# Patient Record
Sex: Female | Born: 1975 | Race: White | Hispanic: Yes | Marital: Single | Smoking: Never smoker
Health system: Southern US, Community
[De-identification: ages and names within clinical notes are randomized; demographics above are authoritative.]

## PROBLEM LIST (undated history)

## (undated) DIAGNOSIS — K219 Gastro-esophageal reflux disease without esophagitis: Secondary | ICD-10-CM

## (undated) DIAGNOSIS — G4700 Insomnia, unspecified: Secondary | ICD-10-CM

## (undated) HISTORY — DX: Insomnia, unspecified: G47.00

## (undated) HISTORY — DX: Gastro-esophageal reflux disease without esophagitis: K21.9

---

## 2006-05-31 ENCOUNTER — Inpatient Hospital Stay (HOSPITAL_COMMUNITY): Admission: AD | Admit: 2006-05-31 | Discharge: 2006-05-31 | Payer: Self-pay | Admitting: Obstetrics and Gynecology

## 2006-09-04 ENCOUNTER — Ambulatory Visit: Payer: Self-pay | Admitting: Obstetrics and Gynecology

## 2006-09-04 ENCOUNTER — Inpatient Hospital Stay (HOSPITAL_COMMUNITY): Admission: AD | Admit: 2006-09-04 | Discharge: 2006-09-04 | Payer: Self-pay | Admitting: Obstetrics & Gynecology

## 2006-09-08 ENCOUNTER — Ambulatory Visit (HOSPITAL_COMMUNITY): Admission: RE | Admit: 2006-09-08 | Discharge: 2006-09-08 | Payer: Self-pay | Admitting: Obstetrics & Gynecology

## 2006-09-20 ENCOUNTER — Ambulatory Visit: Payer: Self-pay | Admitting: Obstetrics and Gynecology

## 2006-09-20 ENCOUNTER — Inpatient Hospital Stay (HOSPITAL_COMMUNITY): Admission: AD | Admit: 2006-09-20 | Discharge: 2006-09-23 | Payer: Self-pay | Admitting: Obstetrics and Gynecology

## 2006-09-30 ENCOUNTER — Inpatient Hospital Stay (HOSPITAL_COMMUNITY): Admission: AD | Admit: 2006-09-30 | Discharge: 2006-09-30 | Payer: Self-pay | Admitting: Family Medicine

## 2006-09-30 ENCOUNTER — Ambulatory Visit: Payer: Self-pay | Admitting: Family Medicine

## 2006-10-01 ENCOUNTER — Ambulatory Visit: Payer: Self-pay | Admitting: *Deleted

## 2006-10-08 ENCOUNTER — Ambulatory Visit: Payer: Self-pay | Admitting: Obstetrics & Gynecology

## 2006-10-13 ENCOUNTER — Inpatient Hospital Stay (HOSPITAL_COMMUNITY): Admission: RE | Admit: 2006-10-13 | Discharge: 2006-10-15 | Payer: Self-pay | Admitting: Obstetrics and Gynecology

## 2006-10-13 ENCOUNTER — Ambulatory Visit: Payer: Self-pay | Admitting: Obstetrics and Gynecology

## 2006-10-13 ENCOUNTER — Encounter: Payer: Self-pay | Admitting: Obstetrics and Gynecology

## 2007-04-30 ENCOUNTER — Ambulatory Visit: Payer: Self-pay | Admitting: Internal Medicine

## 2007-04-30 DIAGNOSIS — R519 Headache, unspecified: Secondary | ICD-10-CM | POA: Insufficient documentation

## 2007-04-30 DIAGNOSIS — L988 Other specified disorders of the skin and subcutaneous tissue: Secondary | ICD-10-CM | POA: Insufficient documentation

## 2007-04-30 DIAGNOSIS — R51 Headache: Secondary | ICD-10-CM

## 2007-04-30 DIAGNOSIS — Z87448 Personal history of other diseases of urinary system: Secondary | ICD-10-CM

## 2007-04-30 LAB — CONVERTED CEMR LAB
Bilirubin Urine: NEGATIVE
Glucose, Urine, Semiquant: NEGATIVE
Ketones, urine, test strip: NEGATIVE
Urobilinogen, UA: NEGATIVE

## 2007-05-07 ENCOUNTER — Ambulatory Visit (HOSPITAL_COMMUNITY): Admission: RE | Admit: 2007-05-07 | Discharge: 2007-05-07 | Payer: Self-pay | Admitting: Internal Medicine

## 2007-05-11 ENCOUNTER — Encounter (INDEPENDENT_AMBULATORY_CARE_PROVIDER_SITE_OTHER): Payer: Self-pay | Admitting: *Deleted

## 2007-05-11 LAB — CONVERTED CEMR LAB
BUN: 8 mg/dL (ref 6–23)
CO2: 21 meq/L (ref 19–32)
Chloride: 109 meq/L (ref 96–112)
Creatinine, Ser: 0.62 mg/dL (ref 0.40–1.20)
Potassium: 4.4 meq/L (ref 3.5–5.3)

## 2007-05-14 ENCOUNTER — Encounter (INDEPENDENT_AMBULATORY_CARE_PROVIDER_SITE_OTHER): Payer: Self-pay | Admitting: Internal Medicine

## 2007-06-05 ENCOUNTER — Encounter (INDEPENDENT_AMBULATORY_CARE_PROVIDER_SITE_OTHER): Payer: Self-pay | Admitting: Internal Medicine

## 2007-09-10 ENCOUNTER — Telehealth (INDEPENDENT_AMBULATORY_CARE_PROVIDER_SITE_OTHER): Payer: Self-pay | Admitting: Internal Medicine

## 2007-10-29 ENCOUNTER — Encounter (INDEPENDENT_AMBULATORY_CARE_PROVIDER_SITE_OTHER): Payer: Self-pay | Admitting: Internal Medicine

## 2008-06-30 ENCOUNTER — Ambulatory Visit: Payer: Self-pay | Admitting: Internal Medicine

## 2008-06-30 DIAGNOSIS — M542 Cervicalgia: Secondary | ICD-10-CM

## 2008-06-30 DIAGNOSIS — M549 Dorsalgia, unspecified: Secondary | ICD-10-CM | POA: Insufficient documentation

## 2008-06-30 LAB — CONVERTED CEMR LAB
Ketones, urine, test strip: NEGATIVE
Nitrite: NEGATIVE
Protein, U semiquant: 30
Specific Gravity, Urine: 1.015
Urobilinogen, UA: 1
pH: 7

## 2008-07-17 ENCOUNTER — Ambulatory Visit (HOSPITAL_COMMUNITY): Admission: RE | Admit: 2008-07-17 | Discharge: 2008-07-17 | Payer: Self-pay | Admitting: Internal Medicine

## 2008-07-22 ENCOUNTER — Encounter (INDEPENDENT_AMBULATORY_CARE_PROVIDER_SITE_OTHER): Payer: Self-pay | Admitting: Internal Medicine

## 2008-07-26 ENCOUNTER — Telehealth (INDEPENDENT_AMBULATORY_CARE_PROVIDER_SITE_OTHER): Payer: Self-pay | Admitting: Internal Medicine

## 2008-07-28 ENCOUNTER — Telehealth (INDEPENDENT_AMBULATORY_CARE_PROVIDER_SITE_OTHER): Payer: Self-pay | Admitting: Internal Medicine

## 2008-08-04 ENCOUNTER — Telehealth (INDEPENDENT_AMBULATORY_CARE_PROVIDER_SITE_OTHER): Payer: Self-pay | Admitting: *Deleted

## 2008-08-30 ENCOUNTER — Ambulatory Visit: Payer: Self-pay | Admitting: Internal Medicine

## 2008-09-12 ENCOUNTER — Encounter (INDEPENDENT_AMBULATORY_CARE_PROVIDER_SITE_OTHER): Payer: Self-pay | Admitting: Internal Medicine

## 2008-09-15 ENCOUNTER — Encounter (INDEPENDENT_AMBULATORY_CARE_PROVIDER_SITE_OTHER): Payer: Self-pay | Admitting: Internal Medicine

## 2008-09-21 ENCOUNTER — Ambulatory Visit (HOSPITAL_COMMUNITY): Admission: RE | Admit: 2008-09-21 | Discharge: 2008-09-21 | Payer: Self-pay | Admitting: Internal Medicine

## 2008-09-22 ENCOUNTER — Encounter (INDEPENDENT_AMBULATORY_CARE_PROVIDER_SITE_OTHER): Payer: Self-pay | Admitting: Internal Medicine

## 2008-10-09 ENCOUNTER — Encounter (INDEPENDENT_AMBULATORY_CARE_PROVIDER_SITE_OTHER): Payer: Self-pay | Admitting: Internal Medicine

## 2009-08-26 ENCOUNTER — Encounter (INDEPENDENT_AMBULATORY_CARE_PROVIDER_SITE_OTHER): Payer: Self-pay | Admitting: Internal Medicine

## 2010-03-11 ENCOUNTER — Encounter: Payer: Self-pay | Admitting: *Deleted

## 2010-03-12 ENCOUNTER — Encounter: Payer: Self-pay | Admitting: Internal Medicine

## 2010-03-20 NOTE — Miscellaneous (Signed)
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 Jearld Fenton 09/12/08--wanted to see CT to be certain not an encephalocele--then to discuss possible surgery

## 2010-03-20 NOTE — Letter (Signed)
Summary: [facility]  [facility]   Imported By: Arta Bruce 10/09/2009 11:26:54  _____________________________________________________________________  External Attachment:    Type:   Image     Comment:   External Document

## 2010-07-03 NOTE — Discharge Summary (Signed)
Carmen, Pierce              ACCOUNT NO.:  [id number]   MEDICAL RECORD NO.:  [id number]          PATIENT TYPE:  INP   LOCATION:  9158                          FACILITY:  [facility]   PHYSICIAN:  Ginger Carne, MD  DATE OF BIRTH:  [birth year]   DATE OF ADMISSION:  09/20/2006  DATE OF DISCHARGE:  09/23/2006                               DISCHARGE SUMMARY   REASON FOR ADMISSION:  Right jaw abscess with pregnancy at [redacted] weeks  gestation, not in active labor.   DISCHARGE DIAGNOSIS:  Right jaw abscess with pregnancy at [redacted] weeks  gestation, not in active labor.   SIGNIFICANT FINDINGS:  On admission, a urine culture was drawn that  showed insignificant growth.  On August 2nd, a swab of the mouth showed  no white blood cells, a few gram-negative rods, a few gram-positive  cocci in pairs and in clusters ruled as normal oropharyngeal flora.  On  August 3rd, a maxillofacial CT scan with contrast was performed and was  significant for a 1 x 3 cm abscess adjacent to the outer cortex of the  right mandible that was associated with apparent large dental cavity in  tooth #30.  There was trace periapical lucency around tooth #30 and also  associated with tooth #31 according to report read by the radiologist on  the CT scan.  Initially on admission on August 3rd, a CBC was drawn that  was significant for a white blood cell count of 13.8 with 82%  neutrophils.  Also on admission, the patient had a temperature of 99.8  on August 3rd at approximately 9 a.m. in the morning.  The rest of her  course in the hospital she did not have any fevers.   HOSPITAL COURSE:  The patient was initially found not to be in active  labor.  She was placed, initially, on ampicillin, gentamicin, and  clindamycin.  After results of the CT scan were found, Maxillofacial  Surgery was consulted and agreed that the patient could be changed to  clindamycin.  On the date of discharge, the patient continued to be  stable without  any adverse events and was discharged home with a 14-day  course of clindamycin 3 times daily.  The date of discharge is September 23, 2006.  The patient was scheduled for close followup with Dr. Warren Danes,  DDS, at 1:15 the same day of discharge for followup on her facial  abscess.   DISCHARGE INSTRUCTIONS:  1. The patient is to follow up with Dr. Warren Danes at [facility] on the date      of discharge, September 23, 2006.  2. The patient is also to take clindamycin orally 3 times daily, 900      mg for 14 days.  3. The patient is to swish and spit with salt water rinses and keep      her head elevated until treatment is completed or she receives      further direction from Dr. Warren Danes.   DISCHARGE MEDICATIONS:  Clindamycin 900 mg orally 3 times a day for 14  days.   DISCHARGE CONDITION:  Stable.  Myrtie Soman, MD      Ginger Carne, MD  Electronically Signed    TE/MEDQ  D:  09/23/2006  T:  09/23/2006  Job:  [id number]

## 2010-07-03 NOTE — Discharge Summary (Signed)
Carmen Pierce, Carmen Pierce              ACCOUNT NO.:  [id number]   MEDICAL RECORD NO.:  [id number]          PATIENT TYPE:  INP   LOCATION:  9147                          FACILITY:  [facility]   PHYSICIAN:  Ginger Carne, MD  DATE OF BIRTH:  [birth year]   DATE OF ADMISSION:  10/13/2006  DATE OF DISCHARGE:  10/15/2006                               DISCHARGE SUMMARY   FINAL DIAGNOSES:  1. Intrauterine pregnancy at 19 and 6 weeks.  2. Dental abscess.   REASON FOR ADMISSION:  This is a 35 year old G6, P6-0-0-6 that was  admitted for a repeat cesarean section for a normal intrauterine  pregnancy.   HOSPITAL COURSE:  This 35 year old G6, P6-0-0-6 at 66 and 6 weeks with a  history of dental abscess and previous C-sections was admitted and taken  to C-section on October 13, 2006.  The patient had a vertical incision  performed with no complications during the procedure.  Surgery was  performed by Dr. Okey Dupre assisted by Dr. Silas Flood.  The patient lost 500 mL of  blood during the procedure.  The patient and infant were stable post  procedure.  The patient was transferred to the floor.  Pain was  controlled, ambulating tolerating p.o., and recovering well with no  complications.  The patient will be discharged in good condition and  have followup in 6 weeks in [facility], and P[facility] Leisure  Love will come out on August 30 to remove her staples.   DISCHARGE MEDICATIONS:  1. Ibuprofen 600 mg p.o. q.6h. p.r.n. for pain.  2. Percocet 5/325 one to two tablets p.o. q.4-6h. p.r.n. for pain.  3. Colace 100 mg p.o. b.i.d. p.r.n. for constipation.  4. Ferrous sulfate 325 mg p.o. b.i.d. for anemia.  5. Prenatal vitamins one tablet p.o. once daily.   PERTINENT LABORATORY DATA:  The patient was O positive, antibody  negative.  Rubella immune.  HBS antigen negative.  Syphilis nonreactive.  HIV nonreactive.  GC negative.  Chlamydia negative.  GBS negative.  Discharge hemoglobin was 9.1, white blood cell  count was 8.5, and  platelets were 125.   DISCHARGE INSTRUCTIONS:  The patient is to have follow up at [facility] in 6 weeks and [facility] to remove staples on  October 18, 2006.     Marisue Ivan, MD      Ginger Carne, MD  Electronically Signed   KL/MEDQ  D:  10/15/2006  T:  10/15/2006  Job:  [id number]

## 2010-07-03 NOTE — Op Note (Signed)
Carmen Pierce, Carmen Pierce              ACCOUNT NO.:  [id number]   MEDICAL RECORD NO.:  [id number]          PATIENT TYPE:  INP   LOCATION:  9147                          FACILITY:  [facility]   PHYSICIAN:  Phil D. Okey Dupre, M.D.     DATE OF BIRTH:  1975-09-28   DATE OF PROCEDURE:  10/13/2006  DATE OF DISCHARGE:                               OPERATIVE REPORT   PROCEDURE:  Low transverse cesarean section and bilateral tubal  occlusion.   PREOPERATIVE DIAGNOSIS:  Repeat cesarean section and voluntary  sterilization.   POSTOPERATIVE DIAGNOSIS:  Repeat cesarean section and voluntary  sterilization.   SURGEON:  Dr. Okey Dupre   FIRST ASSISTANT:  Johnella Moloney, M.D.   ANESTHESIA:  Spinal.   SPECIMENS:  Placenta to pathology.   ESTIMATED BLOOD LOSS:  500 mL.   POSTOPERATIVE CONDITION:  Satisfactory.   DESCRIPTION OF PROCEDURE:  Under satisfactory spinal anesthesia with the  patient dorsal supine position.  A Foley catheter in urinary bladder,  abdomen was prepped and draped in usual sterile manner entered through a  vertical subumbilical incision extending from just below the umbilicus  to just above the symphysis pubis.  The abdomen was entered by layers.  On entering the peritoneal cavity visceral peritoneum the anterior  surface of the uterus was opened transversely by sharp dissection. The  bladder pushed away from the lower uterine segment and was entered by  sharp and blunt dissection and from a LOT presentation the baby was  quite easily delivered.  Cord doubly clamped, divided, baby handed  pediatrician.  Samples of blood taken from the cord for analysis,  placenta spontaneously removed.  The uterus explored and closed with  continuous running locked 0 Vicryl on an atraumatic needle.  Several  figure-of-eight sutures were placed into the suture line where there was  still some oozing to control that. The areas observed for bleeding and  none was noted.  Each fallopian tube was grasped from the  midportion and  a Filshie clip was placed across the tube at that point to occlude the  tube.  Tape, instrument, sponge and needle count reported correct.  This  time and the fascia was closed with continuous running PDS loop suture  and subcutaneous  closure with 2-0 plain catgut sutures.  Skin staples for skin edge  approximation.  Subcutaneous bleeders were controlled with hot cautery.  Dry sterile pressure dressing was applied.  The patient transferred to  recovery in satisfactory condition with Foley catheter draining clear  amber urine at the end of the procedure.      Phil D. Okey Dupre, M.D.  Electronically Signed     PDR/MEDQ  D:  10/13/2006  T:  10/13/2006  Job:  [id number]

## 2010-11-30 LAB — CBC
HCT: 31 — ABNORMAL LOW
Hemoglobin: 10.9 — ABNORMAL LOW
MCHC: 34.8
MCHC: 35.2
MCV: 94.3
Platelets: 125 — ABNORMAL LOW
Platelets: 130 — ABNORMAL LOW
Platelets: 149 — ABNORMAL LOW
RBC: 2.72 — ABNORMAL LOW
RBC: 2.84 — ABNORMAL LOW
RDW: 14.4 — ABNORMAL HIGH
RDW: 14.4 — ABNORMAL HIGH
WBC: 8.5

## 2010-11-30 LAB — DIFFERENTIAL
Eosinophils Absolute: 0.1
Lymphocytes Relative: 25
Lymphs Abs: 2.2
Monocytes Relative: 7
Neutro Abs: 5.6
Neutrophils Relative %: 66

## 2010-11-30 LAB — POCT URINALYSIS DIP (DEVICE)
Bilirubin Urine: NEGATIVE
Ketones, ur: NEGATIVE
Protein, ur: NEGATIVE
pH: 6

## 2010-12-03 LAB — URINE MICROSCOPIC-ADD ON

## 2010-12-03 LAB — URINALYSIS, ROUTINE W REFLEX MICROSCOPIC
Bilirubin Urine: NEGATIVE
Ketones, ur: NEGATIVE
Nitrite: NEGATIVE
Urobilinogen, UA: 1

## 2010-12-03 LAB — DIFFERENTIAL
Eosinophils Absolute: 0
Lymphs Abs: 1.5
Monocytes Absolute: 1 — ABNORMAL HIGH
Monocytes Relative: 7
Neutrophils Relative %: 82 — ABNORMAL HIGH

## 2010-12-03 LAB — URINE CULTURE: Colony Count: 1000

## 2010-12-03 LAB — HIV ANTIBODY (ROUTINE TESTING W REFLEX): HIV: NONREACTIVE

## 2010-12-03 LAB — POCT URINALYSIS DIP (DEVICE)
Nitrite: POSITIVE — AB
Protein, ur: 30 — AB
Urobilinogen, UA: 0.2
pH: 6

## 2010-12-03 LAB — CBC
HCT: 32.3 — ABNORMAL LOW
Hemoglobin: 11.1 — ABNORMAL LOW
MCHC: 34.3
MCV: 94.7
RBC: 3.41 — ABNORMAL LOW
WBC: 13.5 — ABNORMAL HIGH

## 2010-12-03 LAB — RAPID URINE DRUG SCREEN, HOSP PERFORMED
Cocaine: NOT DETECTED
Tetrahydrocannabinol: NOT DETECTED

## 2010-12-03 LAB — CREATININE, SERUM
Creatinine, Ser: 0.62
GFR calc Af Amer: >60
GFR calc non Af Amer: >60

## 2010-12-03 LAB — TYPE AND SCREEN: Antibody Screen: NEGATIVE

## 2010-12-03 LAB — STREP B DNA PROBE

## 2010-12-03 LAB — RPR: RPR Ser Ql: NONREACTIVE

## 2010-12-03 LAB — CULTURE, ROUTINE-ABSCESS: Culture: NORMAL

## 2011-05-16 ENCOUNTER — Other Ambulatory Visit (HOSPITAL_COMMUNITY): Payer: Self-pay | Admitting: Internal Medicine

## 2011-05-16 DIAGNOSIS — R2689 Other abnormalities of gait and mobility: Secondary | ICD-10-CM

## 2011-05-21 ENCOUNTER — Other Ambulatory Visit (HOSPITAL_COMMUNITY): Payer: Self-pay

## 2011-08-13 ENCOUNTER — Other Ambulatory Visit: Payer: Self-pay | Admitting: Obstetrics and Gynecology

## 2013-01-06 ENCOUNTER — Ambulatory Visit: Payer: Self-pay

## 2013-01-20 ENCOUNTER — Ambulatory Visit: Payer: No Typology Code available for payment source | Attending: Internal Medicine

## 2013-02-08 ENCOUNTER — Encounter (HOSPITAL_COMMUNITY): Payer: Self-pay | Admitting: Emergency Medicine

## 2013-02-08 DIAGNOSIS — G44209 Tension-type headache, unspecified, not intractable: Secondary | ICD-10-CM | POA: Insufficient documentation

## 2013-02-08 DIAGNOSIS — M542 Cervicalgia: Secondary | ICD-10-CM | POA: Insufficient documentation

## 2013-02-08 NOTE — ED Notes (Signed)
Pt with headache.  Sts headache has been present for 1 week.  Sts she has been nauseated but no emesis.  Reporting light sensitivity and sts from time to time that she has had some blurred vision.  Reports taking tylenol with minimal relief.

## 2013-02-09 ENCOUNTER — Emergency Department (HOSPITAL_COMMUNITY)
Admission: EM | Admit: 2013-02-09 | Discharge: 2013-02-09 | Disposition: A | Discharge status: Home / Self Care | Payer: No Typology Code available for payment source | Attending: Emergency Medicine | Admitting: Emergency Medicine

## 2013-02-09 DIAGNOSIS — G44209 Tension-type headache, unspecified, not intractable: Secondary | ICD-10-CM

## 2013-02-09 MED ORDER — HYDROCODONE-ACETAMINOPHEN 5-325 MG PO TABS: 2.0000 | ORAL_TABLET | ORAL | Status: DC | PRN

## 2013-02-09 MED ORDER — METHOCARBAMOL 500 MG PO TABS: 500.0000 mg | ORAL_TABLET | Freq: Three times a day (TID) | ORAL | Status: DC | PRN

## 2013-02-09 MED ORDER — CYCLOBENZAPRINE HCL 10 MG PO TABS
5.0000 mg | ORAL_TABLET | Freq: Once | ORAL | Status: AC
  Administered 2013-02-09: 5 mg via ORAL
  Filled 2013-02-09: qty 1

## 2013-02-09 MED ORDER — HYDROCODONE-ACETAMINOPHEN 5-325 MG PO TABS
1.0000 | ORAL_TABLET | Freq: Once | ORAL | Status: AC
  Administered 2013-02-09: 1 via ORAL
  Filled 2013-02-09: qty 1

## 2013-02-09 NOTE — ED Provider Notes (Signed)
CSN: [id number]     Arrival date & time 02/08/13  2144 History   First MD Initiated Contact with Patient 02/09/13 0110     Chief Complaint  Patient presents with  . Headache    HPI  Hispanic female presents with her family with a headache for the last 12 days. He has been intermittent. She states she slept on it "funny". She is Spanish-speaking. Her daughter interprets. She declines a formal interpreter or [employer] interpre[employer] strongly prefers of her daughter interprets for her. She is tender along the right neck and right posterior occiput. She's not had fever. She's not had fall or injury. No vision changes. No nausea vomiting numbness weakness or tingling. She takes Tylenol home which helps minimally. Ice on the neck and head does help  History reviewed. No pertinent past medical history. History reviewed. No pertinent past surgical history. History reviewed. No pertinent family history. History  Substance Use Topics  . Smoking status: Never Smoker   . Smokeless tobacco: Not on file  . Alcohol Use: Not on file   OB History   Grav Para Term Preterm Abortions TAB SAB Ect Mult Living                 Review of Systems  Constitutional: Negative for fever, chills, diaphoresis, appetite change and fatigue.  HENT: Negative for mouth sores, sore throat and trouble swallowing.        Bilateral neck pain and tenderness with movement  Eyes: Negative for visual disturbance.  Respiratory: Negative for cough, chest tightness, shortness of breath and wheezing.   Cardiovascular: Negative for chest pain.  Gastrointestinal: Negative for nausea, vomiting, abdominal pain, diarrhea and abdominal distention.  Endocrine: Negative for polydipsia, polyphagia and polyuria.  Genitourinary: Negative for dysuria, frequency and hematuria.  Musculoskeletal: Negative for gait problem.  Skin: Negative for color change, pallor and rash.  Neurological: Positive for headaches. Negative for dizziness, syncope and  light-headedness.  Hematological: Does not bruise/bleed easily.  Psychiatric/Behavioral: Negative for behavioral problems and confusion.    Allergies  Review of patient's allergies indicates not on file.  Home Medications   Current Outpatient Rx  Name  Route  Sig  Dispense  Refill  . acetaminophen (TYLENOL) 500 MG tablet   Oral   Take 500 mg by mouth every 6 (six) hours as needed for headache.         Marland Kitchen HYDROcodone-acetaminophen (NORCO/VICODIN) 5-325 MG per tablet   Oral   Take 2 tablets by mouth every 4 (four) hours as needed.   10 tablet   0   . methocarbamol (ROBAXIN) 500 MG tablet   Oral   Take 1 tablet (500 mg total) by mouth 3 (three) times daily between meals as needed.   20 tablet   0    BP 116/65  Pulse 76  Temp(Src) 98 F (36.7 C) (Oral)  Resp 17  SpO2 100%  LMP 01/18/2013 Physical Exam  Constitutional: She is oriented to person, place, and time. She appears well-developed and well-nourished. No distress.  HENT:  Head: Normocephalic.  Normal funduscopic exam. Pupils are equal and reactive. Normal cranial nerve function. Neck is supple. She is tender along the right paraspinal musculature.  Eyes: Conjunctivae are normal. Pupils are equal, round, and reactive to light. No scleral icterus.  Neck: Normal range of motion. Neck supple. No thyromegaly present.  Cardiovascular: Normal rate and regular rhythm.  Exam reveals no gallop and no friction rub.   No murmur heard. Pulmonary/Chest: Effort normal  and breath sounds normal. No respiratory distress. She has no wheezes. She has no rales.  Abdominal: Soft. Bowel sounds are normal. She exhibits no distension. There is no tenderness. There is no rebound.  Musculoskeletal: Normal range of motion.  Neurological: She is alert and oriented to person, place, and time.  Skin: Skin is warm and dry. No rash noted.  Psychiatric: She has a normal mood and affect. Her behavior is normal.    ED Course  Procedures  (including critical care time) Labs Review Labs Reviewed - No data to display Imaging Review No results found.  EKG Interpretation   None       MDM   1. Muscle tension headache    Not concerning headache. She has a supple neck. She is afebrile. She is neurologically intact. She has reproducible tenderness along the right paraspinal neck and onto the posterior occipital consistent with muscle tension. She is perfectly appropriate for outpatient treatment with simple muscle relaxants and pain medication. Triage note states blurred vision. With interpretation she denies any changes in her vision.    Rolland Porter, MD 02/09/13 [phone]

## 2013-02-24 ENCOUNTER — Ambulatory Visit: Payer: Self-pay | Admitting: Internal Medicine

## 2013-02-25 ENCOUNTER — Encounter: Payer: Self-pay | Admitting: Internal Medicine

## 2013-02-25 ENCOUNTER — Ambulatory Visit: Payer: No Typology Code available for payment source | Attending: Internal Medicine | Admitting: Internal Medicine

## 2013-02-25 DIAGNOSIS — K219 Gastro-esophageal reflux disease without esophagitis: Secondary | ICD-10-CM

## 2013-02-25 DIAGNOSIS — H938X2 Other specified disorders of left ear: Secondary | ICD-10-CM | POA: Insufficient documentation

## 2013-02-25 DIAGNOSIS — K0262 Dental caries on smooth surface penetrating into dentin: Secondary | ICD-10-CM | POA: Insufficient documentation

## 2013-02-25 DIAGNOSIS — K029 Dental caries, unspecified: Secondary | ICD-10-CM | POA: Insufficient documentation

## 2013-02-25 DIAGNOSIS — H61199 Noninfective disorders of pinna, unspecified ear: Secondary | ICD-10-CM

## 2013-02-25 DIAGNOSIS — H938X1 Other specified disorders of right ear: Secondary | ICD-10-CM | POA: Insufficient documentation

## 2013-02-25 LAB — CBC WITH DIFFERENTIAL/PLATELET
Basophils Absolute: 0 10*3/uL (ref 0.0–0.1)
Basophils Relative: 0 % (ref 0–1)
Eosinophils Absolute: 0.1 10*3/uL (ref 0.0–0.7)
Eosinophils Relative: 1 % (ref 0–5)
HCT: 40.4 % (ref 36.0–46.0)
Hemoglobin: 13.9 g/dL (ref 12.0–15.0)
LYMPHS ABS: 2.3 10*3/uL (ref 0.7–4.0)
LYMPHS PCT: 25 % (ref 12–46)
MCH: 32.2 pg (ref 26.0–34.0)
MCHC: 34.4 g/dL (ref 30.0–36.0)
MCV: 93.5 fL (ref 78.0–100.0)
Monocytes Absolute: 0.6 10*3/uL (ref 0.1–1.0)
Monocytes Relative: 6 % (ref 3–12)
NEUTROS ABS: 6.2 10*3/uL (ref 1.7–7.7)
NEUTROS PCT: 68 % (ref 43–77)
PLATELETS: 284 10*3/uL (ref 150–400)
RBC: 4.32 MIL/uL (ref 3.87–5.11)
RDW: 13.4 % (ref 11.5–15.5)
WBC: 9.3 10*3/uL (ref 4.0–10.5)

## 2013-02-25 MED ORDER — PENICILLIN V POTASSIUM 500 MG PO TABS: 500.0000 mg | ORAL_TABLET | Freq: Four times a day (QID) | ORAL | Status: DC

## 2013-02-25 MED ORDER — OMEPRAZOLE 20 MG PO CPDR: 20.0000 mg | DELAYED_RELEASE_CAPSULE | Freq: Every day | ORAL | Status: DC

## 2013-02-25 MED ORDER — IBUPROFEN 800 MG PO TABS: 800.0000 mg | ORAL_TABLET | Freq: Four times a day (QID) | ORAL | Status: DC | PRN

## 2013-02-25 NOTE — Patient Instructions (Signed)
Caries dental  (Dental Caries) La caries dental es la ms comn de todas las enfermedades de la boca. Ocurre en todas las edades, pero es ms frecuente en nios y adultos jvenes.  CMO SE DESARROLLA LA CARIES DENTAL  El proceso de caries comienza cuando las bacterias de la boca se combinan con los alimentos, (especialmente azcares y almidones) para producir placa. La placa es una sustancia que se adhiere a las superficies duras de los dientes (esmalte dental). Las bacterias de la placa producen cidos que atacan el esmalte de los dientes. Estos cidos tambin pueden atacar la superficie de la raz de un diente (cemento) si este est expuesto. Los ataques repetidos disuelven estas superficies y crean huecos en el diente (cavidades). Si no se tratan, los cidos destruyen las otras capas del diente.  FACTORES DE RIESGO   El consumo frecuente de bebidas azucaradas.   El consumo frecuente de alimentos que contienen azcar y almidn y de aquellos que se quedan fcilmente adheridos a los dientes.   Higiene bucal deficiente.   Sequedad en la boca.   Abuso de sustancias como metanfetaminas.   Arreglos dentales en mal estado o mal hechos.   Trastornos de la alimentacin.   Reflujo gastroesofgico (ERGE).   Ciertos tratamientos de radiacin en la cabeza y el cuello. SNTOMAS  En las etapas tempranas de la caries dental, rara vez hay sntomas. En algunos casos pueden observarse zonas blancas, con aspecto de tiza, sobre el esmalte u otras capas del diente. En las etapas posteriores, los sntomas incluyen:   Hoyos y huecos en el esmalte.  Dolor en los dientes despus de consumir alimentos o bebidas dulces, calientes o fros.  Dolor alrededor del diente.  Inflamacin alrededor del diente. DIAGNSTICO  La mayora de las veces, la caries dental se detecta durante un control habitual. El diagnstico se realiza despus hacer de una detallada historia mdica y odontolgica y de la  observacin de las superficies de los dientes buscando signos de caries dental. En algunos casos se utilizan instrumentos especiales, como rayos lser, para buscar caries dentales. Podrn tomarle radiografas dentales de modo que puedan buscarse caries que no se observan a simple vista (como entre las zonas de contacto entre dos dientes).  TRATAMIENTO  Si la caries dental se encuentra en una etapa temprana, podr revertirse con un tratamiento con flor o la aplicacin de un agente remineralizante en el consultorio del dentista. Es necesario un buen cepillado y el uso del hilo dental para ayudar a estos tratamientos. Si est en etapas ms avanzadas, el tratamiento depender de la ubicacin y la extensin de la destruccin dental:   Si se ha destruido una pequea zona del diente, la zona ser removida y las cavidades se llenarn con un material como una amalgama de oro o plata o un compuesto de resina.   Si se ha destruido una zona grande del diente, la zona destruida ser removida y se colocar una cubierta (corona) sobre la estructura que quede del diente.   Si est afectada la parte central del diente (pulpa), ser necesario realizar un procedimiento llamado tratamiento de conducto antes de llenar la cavidad o colocar una corona.   Si la mayor parte del diente est destruido, ser necesario extirpar el diente (extraerlo). INSTRUCCIONES PARA EL CUIDADO EN EL HOGAR  Podr evitar, detener o revertir las caries dentales en su casa, con una buena higiene bucal. La buena higiene bucal incluye:   Una buena higiene de los dientes al menos dos veces por   da con cepillo e hilo dental.   Use una pasta dental con flor. Tambin puede usar un enjuague dental con flor si se lo recomienda el odontlogo o el mdico.   Limite la cantidad de alimentos y bebidas que contengan azcar y almidones que consume.   Evite el consumo frecuente de estos alimentos y bebidas.   Cumpla con las visitas a un dentista  para realizar controles y limpieza regulares. PREVENCIN   Mantenga una buena higiene bucal.  Considere un sellador dental. Un sellador dental es un revestimiento de material que aplica el dentista a las muescas y huecos de los dientes. El sellador impide que los alimentos queden atrapados en los huecos. Puede proteger a los dientes durante varios aos.  Pida suplementos con flor si vive en una comunidad cuya agua no tiene flor o con agua que tenga bajo contenido de flor. Use suplementos de flor segn las indicaciones del odontlogo o el mdico.  Permita las aplicaciones de flor en los dientes si se lo indica el odontlogo o el mdico. Document Released: 02/04/2005 Document Revised: 10/07/2012 [employer] Patient Information 2014 [employer].  

## 2013-02-25 NOTE — Progress Notes (Signed)
Patient ID: Carmen Pierce, female   DOB: [birth year], 38 y.o.   MRN: [id number] Patient Demographics  Carmen Pierce, is a 38 y.o. female  WUJ:[id number]  NFA:[id number]  DOB - 04/21/1975  CC:  Chief Complaint  Patient presents with  . Establish Care       HPI: Carmen Pierce is a 38 y.o. female here today to establish medical care. Patient has no significant past medical history. Her complaint today include pain in her left jaw and swelling for the past 5 days. This prevented her from chewing and eating well, she has had to take for a while but not able to see a dentist. She also has a soft tissue swelling over left ear since birth which has slowly grown to a larger size lately, no pain associated. No difficulty hearing. No sore throat. She went to the ER recently and was prescribed Vicodin and Robaxin. Since beginning these medications she has been having burning pain in the epigastrium and nausea. No fever. No chest pain. Patient does not smoke cigarette, she does not drink alcohol. Patient has No headache, No chest pain, No abdominal pain - No Nausea, No new weakness tingling or numbness, No Cough - SOB.  No Known Allergies History reviewed. No pertinent past medical history. Current Outpatient Prescriptions on File Prior to Visit  Medication Sig Dispense Refill  . acetaminophen (TYLENOL) 500 MG tablet Take 500 mg by mouth every 6 (six) hours as needed for headache.      Marland Kitchen HYDROcodone-acetaminophen (NORCO/VICODIN) 5-325 MG per tablet Take 2 tablets by mouth every 4 (four) hours as needed.  10 tablet  0  . methocarbamol (ROBAXIN) 500 MG tablet Take 1 tablet (500 mg total) by mouth 3 (three) times daily between meals as needed.  20 tablet  0   No current facility-administered medications on file prior to visit.   Family History  Problem Relation Age of Onset  . Kidney disease Daughter    History   Social History  . Marital Status: Single    Spouse Name: N/A    Number of  Children: N/A  . Years of Education: N/A   Occupational History  . Not on file.   Social History Main Topics  . Smoking status: Never Smoker   . Smokeless tobacco: Not on file  . Alcohol Use: Not on file  . Drug Use: Not on file  . Sexual Activity: Not on file   Other Topics Concern  . Not on file   Social History Narrative  . No narrative on file    Review of Systems: Constitutional: Negative for fever, chills, diaphoresis, activity change, appetite change and fatigue. HENT: Negative for ear pain, nosebleeds, congestion, facial swelling, rhinorrhea, neck pain, neck stiffness and ear discharge.  Eyes: Negative for pain, discharge, redness, itching and visual disturbance. Respiratory: Negative for cough, choking, chest tightness, shortness of breath, wheezing and stridor.  Cardiovascular: Negative for chest pain, palpitations and leg swelling. Gastrointestinal: Negative for abdominal distention. Genitourinary: Negative for dysuria, urgency, frequency, hematuria, flank pain, decreased urine volume, difficulty urinating and dyspareunia.  Musculoskeletal: Negative for back pain, joint swelling, arthralgia and gait problem. Neurological: Negative for dizziness, tremors, seizures, syncope, facial asymmetry, speech difficulty, weakness, light-headedness, numbness and headaches.  Hematological: Negative for adenopathy. Does not bruise/bleed easily. Psychiatric/Behavioral: Negative for hallucinations, behavioral problems, confusion, dysphoric mood, decreased concentration and agitation.    Objective:  There were no vitals filed for this visit.  Physical Exam: Constitutional: Patient appears well-developed and well-nourished. No  distress. HENT: Normocephalic, atraumatic,Multiple dental caries mostly on the left premolar and molar area. External right ear normal. Left ear has a soft tissue swelling, mostly cystic round and attached to the posterior auricle, about 3 x 5 cm in diameter.  Oropharynx is clear and moist.  Eyes: Conjunctivae and EOM are normal. PERRLA, no scleral icterus. Neck: Normal ROM. Neck supple. No JVD. No tracheal deviation. No thyromegaly. CVS: RRR, S1/S2 +, no murmurs, no gallops, no carotid bruit.  Pulmonary: Effort and breath sounds normal, no stridor, rhonchi, wheezes, rales.  Abdominal: Soft. BS +, no distension, tenderness, rebound or guarding.  Musculoskeletal: Normal range of motion. No edema and no tenderness.  Lymphadenopathy: No lymphadenopathy noted, cervical, inguinal or axillary Neuro: Alert. Normal reflexes, muscle tone coordination. No cranial nerve deficit. Skin: Skin is warm and dry. No rash noted. Not diaphoretic. No erythema. No pallor. Psychiatric: Normal mood and affect. Behavior, judgment, thought content normal.  Lab Results  Component Value Date   WBC 8.5 10/15/2006   HGB 9.1* 10/15/2006   HCT 25.5* 10/15/2006   MCV 93.8 10/15/2006   PLT 125* 10/15/2006   Lab Results  Component Value Date   CREATININE 0.62 04/30/2007   BUN 8 04/30/2007   NA 142 04/30/2007   K 4.4 04/30/2007   CL 109 04/30/2007   CO2 21 04/30/2007    No results found for this basename: HGBA1C   Lipid Panel  No results found for this basename: chol, trig, hdl, cholhdl, vldl, ldlcalc       Assessment and plan:   1. Dental caries extending into dentine  - penicillin v potassium (VEETID) 500 MG tablet; Take 1 tablet (500 mg total) by mouth 4 (four) times daily.  Dispense: 20 tablet; Refill: 0 - ibuprofen (ADVIL,MOTRIN) 800 MG tablet; Take 1 tablet (800 mg total) by mouth every 6 (six) hours as needed for moderate pain.  Dispense: 30 tablet; Refill: 0  - Ambulatory referral to Dentistry  2. GERD (gastroesophageal reflux disease) - omeprazole (PRILOSEC) 20 MG capsule; Take 1 capsule (20 mg total) by mouth daily.  Dispense: 30 capsule; Refill: 0  3. Mass of right ear auricle - Ambulatory referral to ENT for possible excision  Baseline Labs: - CMP and  Liver - CBC with Differential - Lipid panel - Urinalysis, Complete - TSH  Follow up in 3 - 6 months or when necessary  The patient was given clear instructions to go to ER or return to medical center if symptoms don't improve, worsen or new problems develop. The patient verbalized understanding. The patient was told to call to get lab results if they haven't heard anything in the next week.     Jeanann LewandowskyJEGEDE, Luanne Krzyzanowski, MD, MHA, FACP, FAAP [facility] [place], [place] [phone]   02/25/2013, 5:26 PM

## 2013-02-25 NOTE — Progress Notes (Signed)
Pt is here to establish care. Pt reports that for a week now she has been experiencing extreme pain at the right side of her jaw.

## 2013-02-26 ENCOUNTER — Encounter: Payer: Self-pay | Admitting: Internal Medicine

## 2013-02-26 LAB — URINALYSIS, COMPLETE
BILIRUBIN URINE: NEGATIVE
Bacteria, UA: NONE SEEN
CASTS: NONE SEEN
Crystals: NONE SEEN
GLUCOSE, UA: NEGATIVE mg/dL
Ketones, ur: NEGATIVE mg/dL
Leukocytes, UA: NEGATIVE
Nitrite: NEGATIVE
PROTEIN: NEGATIVE mg/dL
Specific Gravity, Urine: 1.024 (ref 1.005–1.030)
Squamous Epithelial / LPF: NONE SEEN
Urobilinogen, UA: 0.2 mg/dL (ref 0.0–1.0)
pH: 7 (ref 5.0–8.0)

## 2013-02-26 LAB — LIPID PANEL
CHOL/HDL RATIO: 4.1 ratio
CHOLESTEROL: 211 mg/dL — AB (ref 0–200)
HDL: 52 mg/dL (ref >39)
LDL Cholesterol: 129 mg/dL — ABNORMAL HIGH (ref 0–99)
TRIGLYCERIDES: 149 mg/dL (ref <150)
VLDL: 30 mg/dL (ref 0–40)

## 2013-02-26 LAB — CMP AND LIVER
ALT: 20 U/L (ref 0–35)
AST: 19 U/L (ref 0–37)
Albumin: 4.5 g/dL (ref 3.5–5.2)
Alkaline Phosphatase: 76 U/L (ref 39–117)
BILIRUBIN DIRECT: 0.1 mg/dL (ref 0.0–0.3)
BUN: 9 mg/dL (ref 6–23)
CALCIUM: 9.7 mg/dL (ref 8.4–10.5)
CHLORIDE: 103 meq/L (ref 96–112)
CO2: 27 mEq/L (ref 19–32)
CREATININE: 0.57 mg/dL (ref 0.50–1.10)
Glucose, Bld: 85 mg/dL (ref 70–99)
Indirect Bilirubin: 0.3 mg/dL (ref 0.0–0.9)
Potassium: 4.5 mEq/L (ref 3.5–5.3)
Sodium: 139 mEq/L (ref 135–145)
Total Bilirubin: 0.4 mg/dL (ref 0.3–1.2)
Total Protein: 7.6 g/dL (ref 6.0–8.3)

## 2013-02-26 LAB — TSH: TSH: 2.572 u[IU]/mL (ref 0.350–4.500)

## 2013-04-12 ENCOUNTER — Other Ambulatory Visit (HOSPITAL_COMMUNITY)
Admission: RE | Admit: 2013-04-12 | Discharge: 2013-04-12 | Disposition: A | Discharge status: Home / Self Care | Payer: No Typology Code available for payment source | Source: Ambulatory Visit | Attending: Internal Medicine | Admitting: Internal Medicine

## 2013-04-12 ENCOUNTER — Ambulatory Visit: Payer: No Typology Code available for payment source | Attending: Internal Medicine | Admitting: Internal Medicine

## 2013-04-12 ENCOUNTER — Encounter: Payer: Self-pay | Admitting: Internal Medicine

## 2013-04-12 VITALS — BP 127/82 | HR 76 | Temp 99.1°F | Resp 16 | Ht 65.0 in | Wt 187.0 lb

## 2013-04-12 DIAGNOSIS — Z124 Encounter for screening for malignant neoplasm of cervix: Secondary | ICD-10-CM | POA: Insufficient documentation

## 2013-04-12 DIAGNOSIS — Z1151 Encounter for screening for human papillomavirus (HPV): Secondary | ICD-10-CM | POA: Insufficient documentation

## 2013-04-12 DIAGNOSIS — N760 Acute vaginitis: Secondary | ICD-10-CM | POA: Insufficient documentation

## 2013-04-12 DIAGNOSIS — Z01419 Encounter for gynecological examination (general) (routine) without abnormal findings: Secondary | ICD-10-CM | POA: Insufficient documentation

## 2013-04-12 DIAGNOSIS — Z113 Encounter for screening for infections with a predominantly sexual mode of transmission: Secondary | ICD-10-CM | POA: Insufficient documentation

## 2013-04-12 NOTE — Progress Notes (Signed)
Pt is here today to have a Pap smear. 

## 2013-04-12 NOTE — Patient Instructions (Signed)
Prueba de Papanicolaou (Papanicolaou Test) Es una prueba que se usa para la deteccin del cncer de cuello de tero. Para realizarla, se extraen clulas del cuello del tero que un patlogo (un especialista en el estudio de los tejidos del organismo) examina bajo un microscopio. Las clulas se extraen del cuello del tero mediante el raspado o el cepillado del cuello del tero y sus paredes internas. Es una prueba muy precisa para la deteccin del cncer de cuello de tero y ha sido muy til para reducir las muertes a causa de esta enfermedad. Las pruebas de Papanicolaou se informan en trminos de neoplasia cervical intraepitelial (NIC). El trmino neoplasia significa crecimiento nuevo, intraepitelial significa cambios a nivel celular y cervicouterina significa en el cuello del tero. Los subtipos de NIC son los siguientes:   NIC1: displasia (tejido anormal) leve y leve a moderado  NIC2: displasia moderada y leve a grave  NIC3: displasia grave y carcinoma (tumor maligno) en proceso La prueba de Papanicolaou, cuando se la realiza peridicamente, ha resultado de gran ayuda para la deteccin temprana del cncer de cuello de tero, que es tratable si se lo descubre en su fase inicial. Adems, se la usa para detectar anomalas o hallazgos atpicos. En muchos casos, estos hallazgos son parte del proceso de reparacin del organismo y a menudo se resuelven sin necesidad de otro tratamiento. Si se hace un lavado vaginal, se da un bao de inmersin o se aplica cremas vaginales 48 a 72horas antes del examen, los resultados de la prueba podran ser "insatisfactorios". PREPARACIN PARA EL ESTUDIO La prueba debe realizarse cuando no tiene el perodo menstrual ni sangrado vaginal. HALLAZGOS NORMALES No se observan clulas anormales o atpicas. Los rangos para los resultados normales pueden variar entre diferentes laboratorios y hospitales. Consulte siempre con su mdico despus de hacer el estudio para conocer el  significado de los resultados y si los valores se consideran "dentro de los lmites normales". SIGNIFICADO DEL ESTUDIO  El mdico leer los resultados y hablar con usted sobre la importancia y el significado de los resultados, as como las opciones de tratamiento y la necesidad de realizar pruebas adicionales, si fuera necesario. OBTENCIN DE LOS RESULTADOS DE LAS PRUEBAS  Es su responsabilidad retirar el resultado del estudio. Consulte en el laboratorio cundo y cmo podr obtener los resultados. Document Released: 11/25/2012 [employer] Patient Information 2014 [employer].  

## 2013-04-12 NOTE — Progress Notes (Signed)
Patient ID: Carmen Pierce, female   DOB: 1975-06-27, 38 y.o.   MRN: [id number]   Carmen Reamersminda Crew, is a 38 y.o. female  WUJ:[id number]  [id number]  DOB - 1975-06-27  No chief complaint on file.       Subjective:   Carmen Reamersminda Dunkley is a 38 y.o. female here today for a pap-smear. She is Gravida 6 Para 6, 3 vaginal births and 3 C-sections. She denies any vaginal discharge. Her last birth was October 13 2006. She states her last pap-smear was 2012. She denies any issues with her review of systems.She does not abuse tobacco, alcohol, or drugs. No significant past medical history. Patient has No headache, No chest pain, No abdominal pain - No Nausea, No new weakness tingling or numbness, No Cough - SOB.  Problem  Pap Smear for Cervical Cancer Screening    ALLERGIES: No Known Allergies  PAST MEDICAL HISTORY: History reviewed. No pertinent past medical history.  MEDICATIONS AT HOME: Prior to Admission medications   Medication Sig Start Date End Date Taking? Authorizing Provider  acetaminophen (TYLENOL) 500 MG tablet Take 500 mg by mouth every 6 (six) hours as needed for headache.    Historical Provider, MD  HYDROcodone-acetaminophen (NORCO/VICODIN) 5-325 MG per tablet Take 2 tablets by mouth every 4 (four) hours as needed. 02/09/13   Rolland PorterMark James, MD  ibuprofen (ADVIL,MOTRIN) 800 MG tablet Take 1 tablet (800 mg total) by mouth every 6 (six) hours as needed for moderate pain. 02/25/13   Jeanann Lewandowskylugbemiga Elray Dains, MD  methocarbamol (ROBAXIN) 500 MG tablet Take 1 tablet (500 mg total) by mouth 3 (three) times daily between meals as needed. 02/09/13   Rolland PorterMark James, MD  omeprazole (PRILOSEC) 20 MG capsule Take 1 capsule (20 mg total) by mouth daily. 02/25/13   Jeanann Lewandowskylugbemiga Taneil Lazarus, MD  penicillin v potassium (VEETID) 500 MG tablet Take 1 tablet (500 mg total) by mouth 4 (four) times daily. 02/25/13   Jeanann Lewandowskylugbemiga Dulse Rutan, MD     Objective:   Filed Vitals:   04/12/13 1641  BP: 127/82  Pulse: 76  Temp: 99.1 F  (37.3 C)  TempSrc: Oral  Resp: 16  Height: 5\' 5"  (1.651 m)  Weight: 187 lb (84.823 kg)  SpO2: 100%    Exam General appearance : Awake, alert, not in any distress. Speech Clear. Not toxic looking HEENT: Atraumatic and Normocephalic, pupils equally reactive to light and accomodation Neck: supple, no JVD. No cervical lymphadenopathy.  Chest:Good air entry bilaterally, no added sounds  CVS: S1 S2 regular, no murmurs.  Abdomen: Bowel sounds present, Non tender and not distended with no gaurding, rigidity or rebound. Extremities: B/L Lower Ext shows no edema, both legs are warm to touch Neurology: Awake alert, and oriented X 3, CN II-XII intact, Non focal Skin:No Rash Wounds:N/A Pelvic examination:  Normal female external genitalia, central cervix, minimal vaginal discharge in the introitus, no contact bleeding, negative cervical motion tenderness Data Review  Assessment & Plan   1. Pap smear for cervical cancer screening  - Cytology - PAP - Cervicovaginal ancillary only  Patient was extensively counseled on nutrition and exercise  Return in about 6 months (around 10/10/2013), or if symptoms worsen or fail to improve, for Routine Follow Up.  The patient was given clear instructions to go to ER or return to medical center if symptoms don't improve, worsen or new problems develop. The patient verbalized understanding. The patient was told to call to get lab results if they haven't heard anything in the next week.  Jeanann Lewandowsky, MD, MHA, FACP, FAAP [facility] [place], [place] [phone]   04/12/2013, 5:47 PM

## 2013-04-14 LAB — CERVICOVAGINAL ANCILLARY ONLY
CHLAMYDIA, DNA PROBE: NEGATIVE
Neisseria Gonorrhea: NEGATIVE
WET PREP (BD AFFIRM): NEGATIVE
Wet Prep (BD Affirm): NEGATIVE
Wet Prep (BD Affirm): POSITIVE — AB

## 2013-04-16 ENCOUNTER — Telehealth: Payer: Self-pay

## 2013-04-16 MED ORDER — METRONIDAZOLE 500 MG PO TABS: 500.0000 mg | ORAL_TABLET | Freq: Two times a day (BID) | ORAL | Status: DC

## 2013-04-16 NOTE — Telephone Encounter (Signed)
Message copied by Lestine MountJUAREZ, Ocia Simek L on Fri Apr 16, 2013 10:31 AM ------      Message from: Jeanann LewandowskyJEGEDE, OLUGBEMIGA E      Created: Thu Apr 15, 2013  4:53 PM       Please inform patient that her Pap smear result is normal but there is an infection with bacterial vaginosis which is not a sexually transmitted disease, need to be treated with antibiotics            Please call in Flagyl 500 mg tablet by mouth twice a day for 7 days ------

## 2013-04-16 NOTE — Telephone Encounter (Signed)
Interpreter line used Patient is aware of her pap smear  Results [facility]

## 2013-04-21 ENCOUNTER — Telehealth: Payer: Self-pay | Admitting: Internal Medicine

## 2013-04-21 NOTE — Telephone Encounter (Signed)
Left message for pt to call clinic with questions

## 2013-04-21 NOTE — Telephone Encounter (Signed)
Pt has come in today to see if two scripted medication are able to be taken in unison; HYDROcodone-acetaminophen (NORCO/VICODIN) 5-325 MG per tablet & metroNIDAZOLE (FLAGYL) 500 MG tablet; please advise pt

## 2013-06-07 ENCOUNTER — Ambulatory Visit: Payer: No Typology Code available for payment source | Admitting: Internal Medicine

## 2013-08-24 ENCOUNTER — Ambulatory Visit: Payer: Self-pay

## 2013-09-23 ENCOUNTER — Ambulatory Visit: Payer: Self-pay | Attending: Internal Medicine

## 2013-10-06 ENCOUNTER — Encounter: Payer: Self-pay | Admitting: Internal Medicine

## 2013-10-06 ENCOUNTER — Ambulatory Visit: Payer: Self-pay | Attending: Internal Medicine | Admitting: Internal Medicine

## 2013-10-06 VITALS — BP 110/73 | HR 73 | Temp 98.5°F | Resp 18 | Ht 65.0 in | Wt 201.2 lb

## 2013-10-06 DIAGNOSIS — L723 Sebaceous cyst: Secondary | ICD-10-CM

## 2013-10-06 DIAGNOSIS — N6089 Other benign mammary dysplasias of unspecified breast: Secondary | ICD-10-CM | POA: Insufficient documentation

## 2013-10-06 DIAGNOSIS — Z79899 Other long term (current) drug therapy: Secondary | ICD-10-CM | POA: Insufficient documentation

## 2013-10-06 NOTE — Progress Notes (Signed)
Patient ID: Carmen Pierce, female   DOB: 10-27-1975, 38 y.o.   MRN: [id number]  CC: lump  HPI:  Patient reports that she does regular self breast exam monthly.  She states that she noticed a lump under her right arm/breast over one week ago.  She was currently on her cycle when she noticed the lump but reports that the lump is still present. LMP 8/8-8/12.  She denies any tenderness, skin changes, nipple discharge, or asymmmetry.  She is not currently on birth control at the time.    No Known Allergies History reviewed. No pertinent past medical history. Current Outpatient Prescriptions on File Prior to Visit  Medication Sig Dispense Refill  . acetaminophen (TYLENOL) 500 MG tablet Take 500 mg by mouth every 6 (six) hours as needed for headache.      Marland Kitchen omeprazole (PRILOSEC) 20 MG capsule Take 1 capsule (20 mg total) by mouth daily.  30 capsule  0  . HYDROcodone-acetaminophen (NORCO/VICODIN) 5-325 MG per tablet Take 2 tablets by mouth every 4 (four) hours as needed.  10 tablet  0  . ibuprofen (ADVIL,MOTRIN) 800 MG tablet Take 1 tablet (800 mg total) by mouth every 6 (six) hours as needed for moderate pain.  30 tablet  0  . methocarbamol (ROBAXIN) 500 MG tablet Take 1 tablet (500 mg total) by mouth 3 (three) times daily between meals as needed.  20 tablet  0  . metroNIDAZOLE (FLAGYL) 500 MG tablet Take 1 tablet (500 mg total) by mouth 2 (two) times daily.  14 tablet  0  . penicillin v potassium (VEETID) 500 MG tablet Take 1 tablet (500 mg total) by mouth 4 (four) times daily.  20 tablet  0   No current facility-administered medications on file prior to visit.   Family History  Problem Relation Age of Onset  . Kidney disease Daughter    History   Social History  . Marital Status: Single    Spouse Name: N/A    Number of Children: N/A  . Years of Education: N/A   Occupational History  . Not on file.   Social History Main Topics  . Smoking status: Never Smoker   . Smokeless tobacco: Not  on file  . Alcohol Use: Not on file  . Drug Use: Not on file  . Sexual Activity: Not on file   Other Topics Concern  . Not on file   Social History Narrative  . No narrative on file    Review of Systems: Constitutional: Negative for fever, chills, diaphoresis, activity change, appetite change and fatigue. HENT: Negative for ear pain, nosebleeds, congestion, facial swelling, rhinorrhea, neck pain, neck stiffness and ear discharge.  Eyes: Negative for pain, discharge, redness, itching and visual disturbance. Respiratory: Negative for cough, choking, chest tightness, shortness of breath, wheezing and stridor.  Cardiovascular: Negative for chest pain, palpitations and leg swelling. Gastrointestinal: Negative for abdominal distention. Genitourinary: Negative for dysuria, urgency, frequency, hematuria, flank pain, decreased urine volume, difficulty urinating and dyspareunia.  Musculoskeletal: Negative for back pain, joint swelling, arthralgias and gait problem. Neurological: Negative for dizziness, tremors, seizures, syncope, facial asymmetry, speech difficulty, weakness, light-headedness, numbness and headaches.  Hematological: Negative for adenopathy. Does not bruise/bleed easily. Psychiatric/Behavioral: Negative for hallucinations, behavioral problems, confusion, dysphoric mood, decreased concentration and agitation.    Objective:   Filed Vitals:   10/06/13 1459  BP: 110/73  Pulse: 73  Temp: 98.5 F (36.9 C)  Resp: 18   Physical Exam  Cardiovascular: Normal rate, regular rhythm  and normal heart sounds.   Pulmonary/Chest: Effort normal and breath sounds normal. Right breast exhibits no mass, no nipple discharge, no skin change and no tenderness. Left breast exhibits no mass, no nipple discharge, no skin change and no tenderness.  Abdominal: Soft. Bowel sounds are normal.  Skin: Skin is warm and dry.     Psychiatric: She has a normal mood and affect. Thought content normal.      Lab Results  Component Value Date   WBC 9.3 02/25/2013   HGB 13.9 02/25/2013   HCT 40.4 02/25/2013   MCV 93.5 02/25/2013   PLT 284 02/25/2013   Lab Results  Component Value Date   CREATININE 0.57 02/25/2013   BUN 9 02/25/2013   NA 139 02/25/2013   K 4.5 02/25/2013   CL 103 02/25/2013   CO2 27 02/25/2013    No results found for this basename: HGBA1C   Lipid Panel     Component Value Date/Time   CHOL 211* 02/25/2013 1729   TRIG 149 02/25/2013 1729   HDL 52 02/25/2013 1729   CHOLHDL 4.1 02/25/2013 1729   VLDL 30 02/25/2013 1729   LDLCALC 129* 02/25/2013 1729       Assessment and plan:   Carmen Pierce was seen today for breast problem.  Diagnoses and associated orders for this visit:  Sebaceous cyst of right axilla Advised patient that it may go away without treatment and that she may use warm compresses to that area.  Advised of return precautions.  Return if symptoms worsen or fail to improve.       Holland CommonsKECK, VALERIE, NP-C [facility] [phone] 10/06/2013, 3:18 PM

## 2013-10-06 NOTE — Progress Notes (Signed)
Patient presents for lump on right breast that  she discovered one week ago upon self examination LMP 09/25/13 Requesting refill for prilosec

## 2013-10-06 NOTE — Patient Instructions (Addendum)
Quiste epidrmico  (Epidermal Cyst) Un quiste epidrmico es un bulto pequeo, indoloro, que se encuentra debajo de la piel. Los quistes generalmente aparecen en el rostro, el cuello, el Blakelyestmago, el trax o los genitales. El quiste puede contener una sustancia pastosa con mal olor. No apriete el quiste. Si lo aprieta, puede causar dolor e hinchazn. CUIDADOS EN EL HOGAR   Tome slo los medicamentos que le haya indicado el mdico.  Tome los medicamentos (antibiticos) tal como se le indic. Finalice la prescripcin completa, aunque se sienta mejor. SOLICITE AYUDA DE INMEDIATO SI:   El quiste le duele, est rojo o hinchado.  Si no mejora, o se siente peor.  Tiene preguntas o preocupaciones. ASEGRESE DE QUE:   Comprende estas instrucciones.  Controlar su enfermedad.  Solicitar ayuda de inmediato si no mejora o si empeora. Document Released: 02/04/2005 Document Revised: 08/06/2011 [facility] Patient Information 2015 [employer] [place] [place]. This information is not intended to replace advice given to you by your health care provider. Make sure you discuss any questions you have with your health care provider.

## 2013-11-04 ENCOUNTER — Ambulatory Visit: Payer: Self-pay | Attending: Internal Medicine

## 2014-04-29 ENCOUNTER — Ambulatory Visit: Payer: Self-pay

## 2014-12-31 ENCOUNTER — Ambulatory Visit: Payer: Worker's Compensation

## 2014-12-31 ENCOUNTER — Ambulatory Visit (INDEPENDENT_AMBULATORY_CARE_PROVIDER_SITE_OTHER): Payer: Worker's Compensation | Admitting: Urgent Care

## 2014-12-31 VITALS — BP 112/70 | HR 90 | Temp 98.2°F | Resp 16 | Ht 65.0 in | Wt 195.0 lb

## 2014-12-31 DIAGNOSIS — M25542 Pain in joints of left hand: Secondary | ICD-10-CM

## 2014-12-31 DIAGNOSIS — M7989 Other specified soft tissue disorders: Secondary | ICD-10-CM | POA: Diagnosis not present

## 2014-12-31 DIAGNOSIS — S6992XA Unspecified injury of left wrist, hand and finger(s), initial encounter: Secondary | ICD-10-CM

## 2014-12-31 MED ORDER — NAPROXEN SODIUM 550 MG PO TABS: 550.0000 mg | ORAL_TABLET | Freq: Two times a day (BID) | ORAL | Status: DC

## 2014-12-31 NOTE — Progress Notes (Signed)
    MRN: [id number] DOB: 06-30-1975  Subjective:   Carmen Pierce is a 39 y.o. female presenting for chief complaint of Hand Injury  Reports left hand injury, index and middle finger were trapped against a metal belt line. The line was not going very fast but did trap patient's finger. Her co-workers were able to pry the machine after ~20 minutes. Patient admits that she has since had ongoing finger pain and swelling in her left index and middle fingers. Also has numbness and tingling. Denies redness, bruising, laceration, bleeding, bony deformity. Denies any other aggravating or relieving factors, no other questions or concerns.  Rowene's medications list, allergies, past medical history and past surgical history were reviewed and excluded from this note due to being a worker's comp case.  Objective:   Vitals: BP 112/70 mmHg  Pulse 90  Temp(Src) 98.2 F (36.8 C) (Oral)  Resp 16  Ht 5\' 5"  (1.651 m)  Wt 195 lb (88.451 kg)  BMI 32.45 kg/m2  SpO2 99%  Physical Exam  Constitutional: She is oriented to person, place, and time. She appears well-developed and well-nourished.  Cardiovascular: Normal rate.   Pulmonary/Chest: Effort normal.  Musculoskeletal:       Left hand: She exhibits decreased range of motion (extremes of flexion and extension secondary to pain), tenderness (distal phalynx of 2nd and 3rd fingers), bony tenderness and swelling. She exhibits normal two-point discrimination, normal capillary refill, no deformity and no laceration. Normal sensation noted. Normal strength noted.       Hands: Neurological: She is alert and oriented to person, place, and time.  Skin: Skin is warm and dry. No rash noted. No erythema. No pallor.  Psychiatric: She has a normal mood and affect.   [facility] reading (PRIMARY) by  Dr. Cleta Albertsaub and PA-Nadra Hritz. Left index finger - no obvious fracture. Left middle finger - possible fracture over distal phalynx, please comment.  Dg Finger Middle  Left  12/31/2014  CLINICAL DATA:  Trauma, left hand injury. EXAM: LEFT MIDDLE FINGER 2+V COMPARISON:  None. FINDINGS: Mild third finger soft tissue swelling. Normal alignment without acute osseous finding, fracture, subluxation or dislocation. IMPRESSION: Mild soft tissue swelling.  No acute osseous finding or fracture Electronically Signed   By: Judie PetitM.  Shick M.D.   On: 12/31/2014 14:06   Assessment and Plan :   1. Hand injury, left, initial encounter 2. Joint pain in fingers of left hand 3. Swelling of finger of left hand - Applied finger splints, start Anaprox for pain, swelling and inflammation. Worker's comp instructions provided. Recheck in 1 week.  Wallis BambergMario Tadao Emig, PA-C [facility] [phone] 12/31/2014 12:02 PM

## 2014-12-31 NOTE — Patient Instructions (Signed)
Fracturas de los dedos °(Finger Fracture) °Las fracturas de los dedos son rupturas de los huesos de los dedos. Hay diferentes tipos de fractura. Hay distintas formas de tratamiento para estas fracturas. Su médico hablará con usted sobre la mejor manera de tratar la fractura. °CAUSAS °Una lesión traumática es la causa principal de las fracturas de los dedos. Estas pueden ser: °· Lesiones sufridas mientras se practica un deporte. °· Lesiones en el lugar de trabajo. °· Caídas. °FACTORES DE RIESGO °Estas son algunas actividades que pueden aumentar su riesgo de sufrir fracturas de los dedos: °· Deportes. °· Actividades en el lugar de trabajo que incluyen el uso de maquinaria. °· Una afección denominada osteoporosis, que puede hacer que sus huesos sean menos densos y se fracturen con más facilidad. °SIGNOS Y SÍNTOMAS °Los síntomas principales de una fractura de dedo son dolor e hinchazón dentro de los 15 minutos posteriores a la lesión. Otros síntomas son: °· Hematoma en el dedo. °· Entumecimiento en el dedo. °· Adormecimiento del dedo. °· Huesos expuestos (fractura compuesta) si la fractura es grave. °DIAGNÓSTICO  °La mejor manera de diagnosticar una fractura de hueso es con radiografías. Además, su médico usará esta radiografía para evaluar la posición de los huesos de los dedos fracturados.  °TRATAMIENTO  °Las fracturas de los dedos pueden tratarse con los siguientes métodos:  °· No reducción: significa que los huesos están en su lugar. El dedo se entablilla sin cambiar la posición de las piezas de hueso. Generalmente la tablilla se deja entre una semana y diez días. Esto dependerá de la fractura y de lo que el médico considere adecuado. °· Reducción cerrada: los huesos se colocan nuevamente en su posición, sin necesidad de cirugía. Luego el dedo se entablilla. °· Reducción abierta y fijación interna: el lugar de la fractura está abierto. Luego las piezas de hueso se fijan en el lugar con clavos o con algún tipo de  material duro. Con frecuencia esto es necesario. Depende de la gravedad de la fractura. °INSTRUCCIONES PARA EL CUIDADO EN EL HOGAR  °· Siga las indicaciones del médico en cuanto a la realización de actividades, ejercicios y fisioterapia. °· Utilice los medicamentos de venta libre o recetados para calmar el dolor, el malestar o la fiebre, según se lo indique el médico. °SOLICITE ATENCIÓN MÉDICA SI: °Tiene dolor o hinchazón que limita el movimiento o el uso de los dedos. °SOLICITE ATENCIÓN MÉDICA DE INMEDIATO SI:  °Tiene adormecimiento en el dedo. °ASEGÚRESE DE QUE:  °· Comprende estas instrucciones. °· Controlará su afección. °· Recibirá ayuda de inmediato si no mejora o si empeora. °  °Esta información no tiene como fin reemplazar el consejo del médico. Asegúrese de hacerle al médico cualquier pregunta que tenga. °  °Document Released: 11/14/2004 Document Revised: 11/25/2012 °Elsevier Interactive Patient Education ©2016 Elsevier Inc. ° °

## 2015-01-09 ENCOUNTER — Ambulatory Visit: Payer: Self-pay | Attending: Internal Medicine

## 2015-01-10 ENCOUNTER — Ambulatory Visit (INDEPENDENT_AMBULATORY_CARE_PROVIDER_SITE_OTHER): Payer: Worker's Compensation | Admitting: Urgent Care

## 2015-01-10 VITALS — BP 124/82 | HR 72 | Temp 98.3°F | Resp 16 | Ht 65.0 in | Wt 196.0 lb

## 2015-01-10 DIAGNOSIS — M79642 Pain in left hand: Secondary | ICD-10-CM

## 2015-01-10 DIAGNOSIS — S6992XD Unspecified injury of left wrist, hand and finger(s), subsequent encounter: Secondary | ICD-10-CM

## 2015-01-10 DIAGNOSIS — R202 Paresthesia of skin: Secondary | ICD-10-CM

## 2015-01-10 DIAGNOSIS — R200 Anesthesia of skin: Secondary | ICD-10-CM

## 2015-01-10 MED ORDER — GABAPENTIN 300 MG PO CAPS: 300.0000 mg | ORAL_CAPSULE | Freq: Three times a day (TID) | ORAL | Status: DC

## 2015-01-10 MED ORDER — MELOXICAM 15 MG PO TABS: 7.5000 mg | ORAL_TABLET | Freq: Every day | ORAL | Status: DC

## 2015-01-10 NOTE — Progress Notes (Signed)
    MRN: [id number] DOB: 1975-05-02  Subjective:   Carmen Pierce is a 39 y.o. female presenting for follow up on crush injury to her left hand. Reports left index and middle finger pain, numbness and tingling. Pain is worst in her left middle finger, elicited with grasping for prolonged periods of time. Patient works with [employer], she has been back to work 3 times and felt intermittent shooting pain from her middle finger into her forearm. Has tried anaprox intermittently with some relief. Denies fever, redness, swelling, deformity. Denies any other aggravating or relieving factors, no other questions or concerns.  Carmen Pierce has a current medication list which includes the following prescription(s): naproxen sodium, acetaminophen, methocarbamol, and omeprazole. Also has No Known Allergies.  Carmen Pierce  has no past medical history on file. Also  has past surgical history that includes Cesarean section.  Objective:   Vitals: BP 124/82 mmHg  Pulse 72  Temp(Src) 98.3 F (36.8 C) (Oral)  Resp 16  Ht 5\' 5"  (1.651 m)  Wt 196 lb (88.905 kg)  BMI 32.62 kg/m2  SpO2 99%  LMP 12/23/2014  Physical Exam  Constitutional: She is oriented to person, place, and time. She appears well-developed and well-nourished.  Cardiovascular: Normal rate.   Pulmonary/Chest: Effort normal.  Musculoskeletal:       Left hand: She exhibits decreased range of motion (flexion and extension of her middle finger), tenderness (middle finger) and swelling (trace over middle finger). She exhibits normal capillary refill, no deformity and no laceration. Normal sensation noted. Decreased strength (flexion, 2/5 secondary to pain) noted.  Neurological: She is alert and oriented to person, place, and time.  Skin: Skin is warm and dry. No rash noted. No erythema. No pallor.   Assessment and Plan :   1. Injury of middle finger, left, subsequent encounter 2. Injury of index finger, left, subsequent encounter 3. Numbness and  tingling in left hand 4. Left hand pain - Stop anaprox, start meloxicam, gabapentin. Patient will try to work her regular work schedule and let me know if she has to go to half days. Her employer agreed to send her to a hand therapist. Follow up in 2 weeks or after hand therapy is completed.  Wallis BambergMario Magdeline Prange, PA-C [facility] [phone] 01/10/2015 10:01 AM

## 2015-01-10 NOTE — Patient Instructions (Signed)
If you started physical therapy in the next 2 weeks, you do not have to return for recheck until after you complete your physical therapy.  If you have not been seen by a physical therapist in the next 2 weeks, then come back for a recheck.  Lesin por aplastamiento de los dedos de las manos o de los pies (Crush Injury, Fingers or Toes)  Una lesin por compresin significa que los dedos se han lastimado al ser estrujados (comprimidos). Esto puede provocar hemorragias e hinchazn en los tejidos. Generalmente se produce una acumulacin de sangre bajo la piel. La piel de los dedos generalmente muere y puede mudarse entre una semana y [employer] das despus. Generalmente crece una nueva piel por debajo de la anterior. Si la lesin fue muy grave y los tejidos no sobreviven, podrn tornarse de color negro durante varios das. Las heridas que se producen por el aplastamiento podrn suturarse. Sin embargo, es probable que las lesiones por compresin se infecten ms que otras lesiones. Puede ser que estas heridas no se cierren tan bien como otras heridas para evitar las infecciones. Las uas involucradas generalmente se pierden. Vuelven a crecer despus de algunas semanas. DIAGNSTICO  Podrn tomarle radiografas para comprobar si hay lesiones seas.  TRATAMIENTO  Las fracturas (rupturas del hueso) podrn tratarse con un entablillado, segn su tipo. Generalmente no se requiere tratamiento para las fracturas del ltimo [employer] Corporationhueso de los dedos. INSTRUCCIONES PARA EL CUIDADO DOMICILIARIO  La parte que sufri el aplastamiento deber sostenerse por encima de la lnea del corazn o del centro del pecho, tanto como sea posible, durante los primeros [address]  [address] o segn le hayan indicado. Esto hace que disminuya el dolor y la hinchazn. Si la hinchazn es leve, hay ms posibilidades de que la parte que sufri el aplastamiento sobreviva.  Aplique hielo sobre la zona lesionada.  Ponga el hielo en una bolsa plstica.  Colquese una  toalla entre la piel y la bolsa de hielo.  Deje el hielo durante 15 a 20 minutos 3 a 4 veces por da, durante los 2 [address] das.  Utilice los medicamentos de venta libre o de prescripcin para Chief Technology Officerel dolor, Environmental health practitionerel malestar o la [place]il[place]bre, segn se lo indique el profesional que lo asiste.  Movilice la zona del cuerpo lesionada slo de acuerdo a las indicaciones.  Cambie el vendaje tal como se le indic.  Si el profesional que lo Lubrizol Corporationasiste le pide que concurra a una cita de seguimiento, es importante asistir a ella. No concurrir a la Holiday representativeconsulta puede tener como consecuencia una lesin crnica o [place]manente, dolor, e incapacidad. Si tiene algn problema para asistir a la cita, debe comunicarse con el establecimiento para obtener asistencia. SOLICITE ATENCIN MDICA IMMEDIATAMENTE SI:  Presenta enrojecimiento, hinchazn o aumento del dolor en la herida.  Aparece pus en la herida.  Tiene fiebre.  Advierte un olor ftido que proviene de la herida o del vendaje.  La herida se abre (los bordes no estn unidos) luego de la remocin de las suturas.  Si no puede mover el dedo lesionado. EST SEGURO QUE:   Comprende las instrucciones para el alta mdica.  Controlar su enfermedad.  Solicitar atencin mdica de inmediato segn las indicaciones.   Esta informacin no tiene Theme park managercomo fin reemplazar el consejo del mdico. Asegrese de hacerle al mdico cualquier pregunta que tenga.   Document Released: 02/04/2005 Document Revised: 04/29/2011 Elsevier Interactive Patient Education [employer]2016 Elsevier Inc.

## 2015-01-23 ENCOUNTER — Other Ambulatory Visit: Payer: Self-pay | Admitting: Urgent Care

## 2015-01-23 DIAGNOSIS — S6992XD Unspecified injury of left wrist, hand and finger(s), subsequent encounter: Secondary | ICD-10-CM

## 2015-01-23 NOTE — Addendum Note (Signed)
Addended by: Wallis BambergMANI, Emmry Hinsch on: 01/23/2015 02:49 PM   Modules accepted: Orders

## 2015-02-17 ENCOUNTER — Ambulatory Visit (INDEPENDENT_AMBULATORY_CARE_PROVIDER_SITE_OTHER): Payer: Worker's Compensation | Admitting: Urgent Care

## 2015-02-17 ENCOUNTER — Ambulatory Visit: Payer: Worker's Compensation

## 2015-02-17 VITALS — BP 126/72 | HR 83 | Temp 98.3°F | Resp 16 | Ht 65.5 in | Wt 197.0 lb

## 2015-02-17 DIAGNOSIS — M792 Neuralgia and neuritis, unspecified: Secondary | ICD-10-CM

## 2015-02-17 DIAGNOSIS — G5692 Unspecified mononeuropathy of left upper limb: Secondary | ICD-10-CM

## 2015-02-17 DIAGNOSIS — S6722XD Crushing injury of left hand, subsequent encounter: Secondary | ICD-10-CM

## 2015-02-17 DIAGNOSIS — M79602 Pain in left arm: Secondary | ICD-10-CM

## 2015-02-17 MED ORDER — AMITRIPTYLINE HCL 25 MG PO TABS: 25.0000 mg | ORAL_TABLET | Freq: Every day | ORAL | Status: DC

## 2015-02-17 NOTE — Progress Notes (Signed)
    MRN: [id number] DOB: 02/18/1976  Subjective:   Maren Reamersminda Glassburn is a 39 y.o. female presenting for chief complaint of Follow-up  Patient is presenting for follow up on left hand injury of index and middle finger. She suffered a crush injury on 12/31/2014 with metal object at work, fingers were trapped for ~20 minutes before the object was pried open and her fingers were released. X-rays were negative the day of her injury. She has since tried Anaprox, meloxicam, Robaxin, Gabapentin with minimal relief. Patient started occupational hand therapy on 01/27/2015. Today, she reports constant left hand pain, worse over left index and middle fingers. Pain radiates into her left arm, shoulder and chest. Also has numbness and tingling, weakness. Denies any other aggravating or relieving factors, no other questions or concerns.  Farheen's medications list, allergies, past medical history and past surgical history were reviewed and excluded from this note due to being a worker's comp case.  Objective:   Vitals: BP 126/72 mmHg  Pulse 83  Temp(Src) 98.3 F (36.8 C) (Oral)  Resp 16  Ht 5' 5.5" (1.664 m)  Wt 197 lb (89.359 kg)  BMI 32.27 kg/m2  SpO2 98%  Physical Exam  Constitutional: She is oriented to person, place, and time. She appears well-developed and well-nourished.  Cardiovascular: Normal rate.   Pulmonary/Chest: Effort normal.  Musculoskeletal:       Left hand: She exhibits tenderness (diffuse over left hand). She exhibits normal range of motion, normal capillary refill, no deformity, no laceration and no swelling. Normal sensation noted. Normal strength noted.  Neurological: She is alert and oriented to person, place, and time.  Skin: Skin is warm and dry. No rash noted. No erythema. No pallor.   Left index and middle finger, stat read requested by PA-Avante Carneiro - negative.  Dg Finger Index Left  02/17/2015  CLINICAL DATA:  Smashed finger in door EXAM: LEFT SECOND FINGER 2+V COMPARISON:   None. FINDINGS: Frontal, oblique, and lateral views were obtained. There is no apparent fracture or dislocation. Joint spaces appear intact. No erosive change. IMPRESSION: No fracture or dislocation.  No apparent arthropathy. Electronically Signed   By: Bretta BangWilliam  Woodruff III M.D.   On: 02/17/2015 10:52   Dg Finger Middle Left  02/17/2015  CLINICAL DATA:  39 year old female smashed finger in door. Subsequent encounter. EXAM: LEFT MIDDLE FINGER 2+V COMPARISON:  12/31/2014. FINDINGS: Three views of the left third finger. Bone mineralization is within normal limits. Joint spaces and alignment within normal limits. Distal phalanx appears intact. Other osseous structures in the left third ray appear intact. Visualized surrounding osseous structures appear intact. IMPRESSION: No acute fracture or dislocation identified about the left third finger. Electronically Signed   By: Odessa FlemingH  Hall M.D.   On: 02/17/2015 10:52   Assessment and Plan :   1. Hand crush injury, left, subsequent encounter 2. Neuropathic pain of finger, left 3. Left arm pain - Continue occupational therapy. I requested patient tell her therapist to contact me so we can coordinate her care. I will have patient start amitriptyline for her hand and arm pain. Consider referral to Neuro given her symptoms or orthopedics. Recheck in 2 weeks.  Wallis BambergMario Yanis Juma, PA-C [facility] [phone] 02/17/2015 10:33 AM

## 2015-02-17 NOTE — Patient Instructions (Signed)
Lesin por aplastamiento de los dedos de las manos o de los pies (Crush Injury, Fingers or Toes)  Una lesin por compresin significa que los dedos se han lastimado al ser estrujados (comprimidos). Esto puede provocar hemorragias e hinchazn en los tejidos. Generalmente se produce una acumulacin de sangre bajo la piel. La piel de los dedos generalmente muere y puede mudarse entre una semana y [employer]iez das despus. Generalmente crece una nueva piel por debajo de la anterior. Si la lesin fue muy grave y los tejidos no sobreviven, podrn tornarse de color negro durante varios das. Las heridas que se producen por el aplastamiento podrn suturarse. Sin embargo, es probable que las lesiones por compresin se infecten ms que otras lesiones. Puede ser que estas heridas no se cierren tan bien como otras heridas para evitar las infecciones. Las uas involucradas generalmente se pierden. Vuelven a crecer despus de algunas semanas. DIAGNSTICO  Podrn tomarle radiografas para comprobar si hay lesiones seas.  TRATAMIENTO  Las fracturas (rupturas del hueso) podrn tratarse con un entablillado, segn su tipo. Generalmente no se requiere tratamiento para las fracturas del ltimo [employer] Corporationhueso de los dedos. INSTRUCCIONES PARA EL CUIDADO DOMICILIARIO  La parte que sufri el aplastamiento deber sostenerse por encima de la lnea del corazn o del centro del pecho, tanto como sea posible, durante los primeros [address]  [address] o segn le hayan indicado. Esto hace que disminuya el dolor y la hinchazn. Si la hinchazn es leve, hay ms posibilidades de que la parte que sufri el aplastamiento sobreviva.  Aplique hielo sobre la zona lesionada.  Ponga el hielo en una bolsa plstica.  Colquese una toalla entre la piel y la bolsa de hielo.  Deje el hielo durante 15 a 20 minutos 3 a 4 veces por da, durante los 2 [address] das.  Utilice los medicamentos de venta libre o de prescripcin para Chief Technology Officerel dolor, Environmental health practitionerel malestar o la [place]bre, segn se lo  indique el profesional que lo asiste.  Movilice la zona del cuerpo lesionada slo de acuerdo a las indicaciones.  Cambie el vendaje tal como se le indic.  Si el profesional que lo Lubrizol Corporationasiste le pide que concurra a una cita de seguimiento, es importante asistir a ella. No concurrir a la Holiday representativeconsulta puede tener como consecuencia una lesin crnica o [place]permanente, dolor, e incapacidad. Si tiene algn problema para asistir a la cita, debe comunicarse con el establecimiento para obtener asistencia. SOLICITE ATENCIN MDICA IMMEDIATAMENTE SI:  Presenta enrojecimiento, hinchazn o aumento del dolor en la herida.  Aparece pus en la herida.  Tiene fiebre.  Advierte un olor ftido que proviene de la herida o del vendaje.  La herida se abre (los bordes no estn unidos) luego de la remocin de las suturas.  Si no puede mover el dedo lesionado. EST SEGURO QUE:   Comprende las instrucciones para el alta mdica.  Controlar su enfermedad.  Solicitar atencin mdica de inmediato segn las indicaciones.   Esta informacin no tiene Theme park managercomo fin reemplazar el consejo del mdico. Asegrese de hacerle al mdico cualquier pregunta que tenga.   Document Released: 02/04/2005 Document Revised: 04/29/2011 Elsevier Interactive Patient Education [employer]2016 Elsevier Inc.

## 2015-02-20 ENCOUNTER — Other Ambulatory Visit: Payer: Self-pay | Admitting: Urgent Care

## 2015-02-20 DIAGNOSIS — M792 Neuralgia and neuritis, unspecified: Secondary | ICD-10-CM

## 2015-02-20 DIAGNOSIS — S6992XD Unspecified injury of left wrist, hand and finger(s), subsequent encounter: Secondary | ICD-10-CM

## 2015-03-03 ENCOUNTER — Ambulatory Visit (INDEPENDENT_AMBULATORY_CARE_PROVIDER_SITE_OTHER): Payer: Worker's Compensation | Admitting: Urgent Care

## 2015-03-03 ENCOUNTER — Encounter: Payer: Self-pay | Admitting: Urgent Care

## 2015-03-03 VITALS — BP 120/80 | HR 65 | Temp 98.7°F | Resp 18

## 2015-03-03 DIAGNOSIS — S6722XD Crushing injury of left hand, subsequent encounter: Secondary | ICD-10-CM | POA: Diagnosis not present

## 2015-03-03 DIAGNOSIS — M7989 Other specified soft tissue disorders: Secondary | ICD-10-CM

## 2015-03-03 DIAGNOSIS — M79645 Pain in left finger(s): Secondary | ICD-10-CM

## 2015-03-03 MED ORDER — PREDNISONE 10 MG PO TABS
ORAL_TABLET | ORAL | Status: DC
Start: 2015-03-03 — End: 2016-03-31

## 2015-03-03 NOTE — Patient Instructions (Addendum)
Lesin por aplastamiento de los dedos de las manos o de los pies (Crush Injury, Fingers or Toes)  Una lesin por compresin significa que los dedos se han lastimado al ser estrujados (comprimidos). Esto puede provocar hemorragias e hinchazn en los tejidos. Generalmente se produce una acumulacin de sangre bajo la piel. La piel de los dedos generalmente muere y puede mudarse entre una semana y diez das despus. Generalmente crece una nueva piel por debajo de la anterior. Si la lesin fue muy grave y los tejidos no sobreviven, podrn tornarse de color negro durante varios das. Las heridas que se producen por el aplastamiento podrn suturarse. Sin embargo, es probable que las lesiones por compresin se infecten ms que otras lesiones. Puede ser que estas heridas no se cierren tan bien como otras heridas para evitar las infecciones. Las uas involucradas generalmente se pierden. Vuelven a crecer despus de algunas semanas. DIAGNSTICO  Podrn tomarle radiografas para comprobar si hay lesiones seas.  TRATAMIENTO  Las fracturas (rupturas del hueso) podrn tratarse con un entablillado, segn su tipo. Generalmente no se requiere tratamiento para las fracturas del ltimo hueso de los dedos. INSTRUCCIONES PARA EL CUIDADO DOMICILIARIO  La parte que sufri el aplastamiento deber sostenerse por encima de la lnea del corazn o del centro del pecho, tanto como sea posible, durante los primeros das o segn le hayan indicado. Esto hace que disminuya el dolor y la hinchazn. Si la hinchazn es leve, hay ms posibilidades de que la parte que sufri el aplastamiento sobreviva.  Aplique hielo sobre la zona lesionada.  Ponga el hielo en una bolsa plstica.  Colquese una toalla entre la piel y la bolsa de hielo.  Deje el hielo durante 15 a 20 minutos 3 a 4 veces por da, durante los 2 primeros das.  Utilice los medicamentos de venta libre o de prescripcin para el dolor, el malestar o la fiebre, segn se lo  indique el profesional que lo asiste.  Movilice la zona del cuerpo lesionada slo de acuerdo a las indicaciones.  Cambie el vendaje tal como se le indic.  Si el profesional que lo asiste le pide que concurra a una cita de seguimiento, es importante asistir a ella. No concurrir a la consulta puede tener como consecuencia una lesin crnica o permanente, dolor, e incapacidad. Si tiene algn problema para asistir a la cita, debe comunicarse con el establecimiento para obtener asistencia. SOLICITE ATENCIN MDICA IMMEDIATAMENTE SI:  Presenta enrojecimiento, hinchazn o aumento del dolor en la herida.  Aparece pus en la herida.  Tiene fiebre.  Advierte un olor ftido que proviene de la herida o del vendaje.  La herida se abre (los bordes no estn unidos) luego de la remocin de las suturas.  Si no puede mover el dedo lesionado. EST SEGURO QUE:   Comprende las instrucciones para el alta mdica.  Controlar su enfermedad.  Solicitar atencin mdica de inmediato segn las indicaciones.   Esta informacin no tiene como fin reemplazar el consejo del mdico. Asegrese de hacerle al mdico cualquier pregunta que tenga.   Document Released: 02/04/2005 Document Revised: 04/29/2011 Elsevier Interactive Patient Education 2016 Elsevier Inc.  

## 2015-03-03 NOTE — Progress Notes (Signed)
    MRN: [id number] DOB: Mar 25, 1975  Subjective:   Carmen Pierce is a 40 y.o. female presenting for follow up on her left injury sustained while at work.  Patient is presenting for follow up on a crush injury that occurred while at work. Up to now, x-rays have been negative for fracture. Today, she reports ongoing pain in her left hand up to forearm. She also admits swelling of her fingers while at work. She has tried meloxicam, flexeril, gabapentin, amitriptyline with minimal relief. Patient has also undergone occupational hand therapy. At her last visit, a referral to hand specialist was requested but this has not yet happened.  Raelan medications, allergies, pmh and psh were reviewed and updated as necessary. This was not included due to worker's comp case.   Objective:   Vitals: BP 120/80 mmHg  Pulse 65  Temp(Src) 98.7 F (37.1 C) (Oral)  Resp 18  SpO2 99%  LMP 02/07/2015  Physical Exam  Constitutional: She is oriented to person, place, and time. She appears well-developed and well-nourished.  Cardiovascular: Normal rate.   Pulmonary/Chest: Effort normal.  Musculoskeletal:       Left hand: She exhibits decreased range of motion (flexion of fingers), tenderness (over demarcated area) and swelling (trace edema of middle (3rd finger)). She exhibits normal capillary refill, no deformity and no laceration. Normal sensation noted. Normal strength noted.       Hands: Neurological: She is alert and oriented to person, place, and time.  Skin: Skin is warm and dry. No rash noted. No erythema. No pallor.   Assessment and Plan :   1. Hand crush injury, left, subsequent encounter 2. Finger pain, left 3. Swelling of left hand - Referral requested for hand specialist again today. Start prednisone short taper. Follow up if referral does not take place.  Wallis BambergMario Dabney Schanz, PA-C [facility] [phone] 03/03/2015 6:29 PM

## 2015-03-08 ENCOUNTER — Encounter: Payer: Self-pay | Admitting: Internal Medicine

## 2015-03-08 ENCOUNTER — Ambulatory Visit: Payer: Self-pay | Attending: Internal Medicine | Admitting: Internal Medicine

## 2015-03-08 VITALS — BP 125/80 | HR 76 | Temp 98.0°F | Resp 17 | Wt 199.6 lb

## 2015-03-08 DIAGNOSIS — Z124 Encounter for screening for malignant neoplasm of cervix: Secondary | ICD-10-CM

## 2015-03-08 DIAGNOSIS — L853 Xerosis cutis: Secondary | ICD-10-CM

## 2015-03-08 DIAGNOSIS — Z01419 Encounter for gynecological examination (general) (routine) without abnormal findings: Secondary | ICD-10-CM | POA: Insufficient documentation

## 2015-03-08 DIAGNOSIS — R51 Headache: Secondary | ICD-10-CM | POA: Insufficient documentation

## 2015-03-08 NOTE — Progress Notes (Signed)
Patient ID: Carmen Pierce, female   DOB: 03-Dec-1975, 40 y.o.   MRN: [id number]  CC: pap smear  HPI: Carmen Pierce is a 40 y.o. female here today for a pap smear.  Patient has past medical history of headaches. Patient denies symptoms of vaginal discharge, itch, odor, lesion. She would like STD testing today. Patient also complains of dry skin on her eye lids that itch, burn, and peel when she washes her face. She denies a history of eczema.   No Known Allergies History reviewed. No pertinent past medical history. Current Outpatient Prescriptions on File Prior to Visit  Medication Sig Dispense Refill  . amitriptyline (ELAVIL) 25 MG tablet Take 1 tablet (25 mg total) by mouth at bedtime. 60 tablet 1  . gabapentin (NEURONTIN) 300 MG capsule Take 1 capsule (300 mg total) by mouth 3 (three) times daily. 90 capsule 3  . methocarbamol (ROBAXIN) 500 MG tablet Take 1 tablet (500 mg total) by mouth 3 (three) times daily between meals as needed. 20 tablet 0  . predniSONE (DELTASONE) 10 MG tablet Take 2 pills daily with food for Days 1-3. Take 1 pill daily Days 4-6. 9 tablet 0   No current facility-administered medications on file prior to visit.   Family History  Problem Relation Age of Onset  . Kidney disease Daughter    Social History   Social History  . Marital Status: Single    Spouse Name: N/A  . Number of Children: N/A  . Years of Education: N/A   Occupational History  . Not on file.   Social History Main Topics  . Smoking status: Never Smoker   . Smokeless tobacco: Not on file  . Alcohol Use: Not on file  . Drug Use: Not on file  . Sexual Activity: Not on file   Other Topics Concern  . Not on file   Social History Narrative    Review of Systems: Constitutional: Negative for fever, chills, diaphoresis, activity change, appetite change and fatigue. HENT: Negative for ear pain, nosebleeds, congestion, facial swelling, rhinorrhea, neck pain, neck stiffness and ear discharge.   Eyes: Negative for pain, discharge, redness, itching and visual disturbance. Respiratory: Negative for cough, choking, chest tightness, shortness of breath, wheezing and stridor.  Cardiovascular: Negative for chest pain, palpitations and leg swelling. Gastrointestinal: Negative for abdominal distention. Genitourinary: Negative for dysuria, urgency, frequency, hematuria, flank pain, decreased urine volume, difficulty urinating and dyspareunia.  Musculoskeletal: Negative for back pain, joint swelling, arthralgias and gait problem. Neurological: Negative for dizziness, tremors, seizures, syncope, facial asymmetry, speech difficulty, weakness, light-headedness, numbness and headaches.  Hematological: Negative for adenopathy. Does not bruise/bleed easily. Psychiatric/Behavioral: Negative for hallucinations, behavioral problems, confusion, dysphoric mood, decreased concentration and agitation.    Objective:   Filed Vitals:   03/08/15 1640  BP: 125/80  Pulse: 76  Temp: 98 F (36.7 C)  Resp: 17    Physical Exam  Constitutional: She is oriented to person, place, and time.  Pulmonary/Chest: Right breast exhibits no mass. Left breast exhibits no mass.  Abdominal: Soft. Bowel sounds are normal. There is no tenderness.  Genitourinary: Vagina normal and uterus normal. No breast tenderness or discharge. Cervix exhibits no motion tenderness, no discharge and no friability. Right adnexum displays no tenderness. Left adnexum displays no tenderness.  Lymphadenopathy:       Right: No inguinal adenopathy present.       Left: No inguinal adenopathy present.  Neurological: She is alert and oriented to person, place, and time.  Skin:  Skin is warm and dry. No rash noted. No erythema.  Psychiatric: She has a normal mood and affect.   .  Lab Results  Component Value Date   WBC 9.3 02/25/2013   HGB 13.9 02/25/2013   HCT 40.4 02/25/2013   MCV 93.5 02/25/2013   PLT 284 02/25/2013   Lab Results   Component Value Date   CREATININE 0.57 02/25/2013   BUN 9 02/25/2013   NA 139 02/25/2013   K 4.5 02/25/2013   CL 103 02/25/2013   CO2 27 02/25/2013    No results found for: HGBA1C Lipid Panel     Component Value Date/Time   CHOL 211* 02/25/2013 1729   TRIG 149 02/25/2013 1729   HDL 52 02/25/2013 1729   CHOLHDL 4.1 02/25/2013 1729   VLDL 30 02/25/2013 1729   LDLCALC 129* 02/25/2013 1729       Assessment and plan:   Amellia was seen today for gynecologic exam.  Diagnoses and all orders for this visit:  Papanicolaou smear -     Cytology - PAP [place] -     HIV antibody (with reflex) -     RPR  Dry skin Use daily moisturizer on face to prevent drying of skin No rash or peeling noted on exam today.    Due to language barrier, an interpreter was present during the history-taking and subsequent discussion (and for part of the physical exam) with this patient.  Return if symptoms worsen or fail to improve.        Ambrose Finland, NP-C [facility] [phone] 03/08/2015, 5:08 PM

## 2015-03-08 NOTE — Progress Notes (Signed)
Interpreter line used Harriett Sine ID# [id number] Patient is her for her annual pap Patient also complains of her eye lids get red and feel dry at times

## 2015-03-09 ENCOUNTER — Telehealth: Payer: Self-pay

## 2015-03-09 LAB — RPR

## 2015-03-09 LAB — HIV ANTIBODY (ROUTINE TESTING W REFLEX): HIV 1&2 Ab, 4th Generation: NONREACTIVE

## 2015-03-09 NOTE — Telephone Encounter (Signed)
-----   Message from Ambrose Finland, NP sent at 03/09/2015  9:03 AM EST ----- Negative HIV and syphilis

## 2015-03-09 NOTE — Telephone Encounter (Signed)
Interpreter line used Carmen Pierce ID# [id number] Patient is aware of her lab results Negative HIV and syphilis

## 2015-03-10 ENCOUNTER — Telehealth: Payer: Self-pay

## 2015-03-10 LAB — CERVICOVAGINAL ANCILLARY ONLY
CHLAMYDIA, DNA PROBE: NEGATIVE
Neisseria Gonorrhea: NEGATIVE
Trichomonas: NEGATIVE
Wet Prep (BD Affirm): POSITIVE — AB

## 2015-03-10 LAB — CYTOLOGY - PAP

## 2015-03-10 MED ORDER — FLUCONAZOLE 150 MG PO TABS: 150.0000 mg | ORAL_TABLET | Freq: Once | ORAL | Status: DC

## 2015-03-10 NOTE — Telephone Encounter (Signed)
-----   Message from Ambrose Finland, NP sent at 03/10/2015 10:31 AM EST ----- Positive for yeast. Please send diflucan 150 mg to take PO once. Send 1 tablet with 1 refill. May repeat in one week if no improvement

## 2015-03-10 NOTE — Telephone Encounter (Signed)
Interpreter line used Pedro ID# [phone] Patient is aware of her yeast infection and to pick up her medication At the [facility] on [address]

## 2015-03-11 ENCOUNTER — Telehealth: Payer: Self-pay | Admitting: Family Medicine

## 2015-03-11 NOTE — Telephone Encounter (Signed)
Pt is calling to see if you have spoken with the nurse about her hand injury

## 2015-03-13 ENCOUNTER — Telehealth: Payer: Self-pay

## 2015-03-13 NOTE — Telephone Encounter (Signed)
No, I have not seen a message from the therapist she was seeing. However, she will now be followed by the hand specialist and no longer has to follow up here. Please have her follow up with the hand specialist. Thank you!

## 2015-03-13 NOTE — Telephone Encounter (Signed)
Interpreter line used [place] ID# [id number] Tried to call patient  This am Patient not available Message left on voice mail to return our call

## 2015-03-13 NOTE — Telephone Encounter (Signed)
-----   Message from Ambrose Finland, NP sent at 03/10/2015  5:57 PM EST ----- Patient pap is negative for malignancies and infections. Will repeat in 3 years.

## 2015-03-14 ENCOUNTER — Telehealth: Payer: Self-pay | Admitting: Internal Medicine

## 2015-03-14 NOTE — Telephone Encounter (Signed)
error 

## 2015-03-14 NOTE — Telephone Encounter (Signed)
Patient called returning nurse's call to review results  °

## 2015-03-14 NOTE — Telephone Encounter (Signed)
Left message for pt to call back  °

## 2015-03-15 ENCOUNTER — Telehealth: Payer: Self-pay

## 2015-03-15 NOTE — Telephone Encounter (Signed)
Called patient this am and she aware of her pap results And to pick up her prescription for diflucan

## 2015-08-23 ENCOUNTER — Ambulatory Visit: Payer: Self-pay | Attending: Internal Medicine

## 2016-03-21 ENCOUNTER — Ambulatory Visit: Payer: Self-pay

## 2016-03-30 ENCOUNTER — Emergency Department (HOSPITAL_COMMUNITY): Payer: Self-pay

## 2016-03-30 ENCOUNTER — Emergency Department (HOSPITAL_COMMUNITY)
Admission: EM | Admit: 2016-03-30 | Discharge: 2016-03-31 | Disposition: A | Discharge status: Home / Self Care | Payer: Self-pay | Attending: Emergency Medicine | Admitting: Emergency Medicine

## 2016-03-30 ENCOUNTER — Encounter (HOSPITAL_COMMUNITY): Payer: Self-pay | Admitting: Emergency Medicine

## 2016-03-30 DIAGNOSIS — M79602 Pain in left arm: Secondary | ICD-10-CM | POA: Insufficient documentation

## 2016-03-30 DIAGNOSIS — Z79899 Other long term (current) drug therapy: Secondary | ICD-10-CM | POA: Insufficient documentation

## 2016-03-30 LAB — BASIC METABOLIC PANEL
ANION GAP: 8 (ref 5–15)
BUN: 10 mg/dL (ref 6–20)
CALCIUM: 9.5 mg/dL (ref 8.9–10.3)
CO2: 26 mmol/L (ref 22–32)
CREATININE: 0.59 mg/dL (ref 0.44–1.00)
Chloride: 107 mmol/L (ref 101–111)
Glucose, Bld: 103 mg/dL — ABNORMAL HIGH (ref 65–99)
Potassium: 3.5 mmol/L (ref 3.5–5.1)
Sodium: 141 mmol/L (ref 135–145)

## 2016-03-30 LAB — CBC
HCT: 37.8 % (ref 36.0–46.0)
HEMOGLOBIN: 12.8 g/dL (ref 12.0–15.0)
MCH: 31.4 pg (ref 26.0–34.0)
MCHC: 33.9 g/dL (ref 30.0–36.0)
MCV: 92.9 fL (ref 78.0–100.0)
PLATELETS: 247 10*3/uL (ref 150–400)
RBC: 4.07 MIL/uL (ref 3.87–5.11)
RDW: 13 % (ref 11.5–15.5)
WBC: 8.5 10*3/uL (ref 4.0–10.5)

## 2016-03-30 LAB — I-STAT TROPONIN, ED: TROPONIN I, POC: 0 ng/mL (ref 0.00–0.08)

## 2016-03-30 NOTE — ED Triage Notes (Signed)
Pt reports pain to L arm radiating from L finger tips radiating up to breast. States ongoing one week. Seems to think its related to a work injury one year ago. States "poking" upon exhalation.

## 2016-03-31 ENCOUNTER — Ambulatory Visit (HOSPITAL_COMMUNITY)
Admission: RE | Admit: 2016-03-31 | Discharge: 2016-03-31 | Disposition: A | Discharge status: Home / Self Care | Payer: Self-pay | Source: Ambulatory Visit | Attending: Emergency Medicine | Admitting: Emergency Medicine

## 2016-03-31 DIAGNOSIS — R52 Pain, unspecified: Secondary | ICD-10-CM | POA: Insufficient documentation

## 2016-03-31 DIAGNOSIS — M79609 Pain in unspecified limb: Secondary | ICD-10-CM

## 2016-03-31 MED ORDER — ENOXAPARIN SODIUM 100 MG/ML ~~LOC~~ SOLN
1.0000 mg/kg | Freq: Once | SUBCUTANEOUS | Status: AC
  Administered 2016-03-31: 90 mg via SUBCUTANEOUS
  Filled 2016-03-31: qty 1

## 2016-03-31 MED ORDER — TRAMADOL HCL 50 MG PO TABS: 50.0000 mg | ORAL_TABLET | Freq: Four times a day (QID) | ORAL | 0 refills | Status: DC | PRN

## 2016-03-31 NOTE — ED Notes (Signed)
Pt verbalized understanding of d/c instructions and has no further questions. Pt stable and NAD. Pt to come back in the morning for VAS US of left arm. VSS.

## 2016-03-31 NOTE — Progress Notes (Signed)
VASCULAR LAB PRELIMINARY  PRELIMINARY  PRELIMINARY  PRELIMINARY  Left upper extremity venous duplex completed.    Preliminary report:  There is no DVT or SVT noted in the left upper extremity.   Jevonte Clanton, RVT 03/31/2016, 8:29 AM

## 2016-03-31 NOTE — ED Provider Notes (Signed)
[facility]-EMERGENCY DEPT Provider Note   CSN: [id number] Arrival date & time: 03/30/16  2049    History   Chief Complaint Chief Complaint  Patient presents with  . Chest Pain  . Arm Pain    HPI Carmen Pierce is a 41 y.o. female.  41 year old female presents to the emergency department for evaluation of left arm pain. She has had a history of chronic arm pain for over a year present in her second through fourth digits of her left hand. This pain would intermittently radiate to her elbow. She states that she has noticed sharp pains traveling up to her shoulder over the past 2 days. She has also felt similar discomfort into the left side of her chest. She states that it feels as though someone is "poking her with needles". Initial onset of pain was after an injury at work where the patient's hand was caught in a piece of machinery. She has been followed by Dr. Eulah Pont of orthopedics for this issue and previously referred to pain management. No medications taken PTA for symptoms. Symptoms not associated with fever, diaphoresis, N/V, extremity weakness, or syncope/near syncope.   The history is provided by the patient. No language interpreter was used.  Chest Pain    Arm Pain  Associated symptoms include chest pain.    History reviewed. No pertinent past medical history.  Patient Active Problem List   Diagnosis Date Noted  . Pap smear for cervical cancer screening 04/12/2013  . Dental caries extending into dentine 02/25/2013  . Mass of left ear auricle 02/25/2013  . GERD (gastroesophageal reflux disease) 02/25/2013  . Mass of right ear auricle 02/25/2013  . NECK PAIN, RIGHT 06/30/2008  . BACK PAIN, RIGHT 06/30/2008  . 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 HEADACHE 04/30/2007  . HEMATURIA, MICROSCOPIC, HX OF 04/30/2007    Past Surgical History:  Procedure Laterality Date  . CESAREAN SECTION      OB History    No data available       Home Medications    Prior to  Admission medications   Medication Sig Start Date End Date Taking? Authorizing Provider  traMADol (ULTRAM) 50 MG tablet Take 1 tablet (50 mg total) by mouth every 6 (six) hours as needed for moderate pain or severe pain. 03/31/16   Antony Madura, PA-C    Family History Family History  Problem Relation Age of Onset  . Kidney disease Daughter     Social History Social History  Substance Use Topics  . Smoking status: Never Smoker  . Smokeless tobacco: Never Used  . Alcohol use No     Allergies   Patient has no known allergies.   Review of Systems Review of Systems  Cardiovascular: Positive for chest pain.   Ten systems reviewed and are negative for acute change, except as noted in the HPI.    Physical Exam Updated Vital Signs BP 115/71   Pulse 73   Temp 98.2 F (36.8 C) (Oral)   Resp 15   LMP 03/23/2016 (Exact Date)   SpO2 98%   Physical Exam  Constitutional: She is oriented to person, place, and time. She appears well-developed and well-nourished. No distress.  Nontoxic and in no acute distress  HENT:  Head: Normocephalic and atraumatic.  Mouth/Throat: Oropharynx is clear and moist.  Eyes: Conjunctivae and EOM are normal. No scleral icterus.  Neck: Normal range of motion.  No nuchal rigidity or meningismus  Cardiovascular: Normal rate, regular rhythm and intact distal pulses.  Pulmonary/Chest: Effort normal. No respiratory distress. She has no wheezes. She has no rales.  Lungs clear to auscultation bilaterally. Respirations even and unlabored.  Musculoskeletal: Normal range of motion.  Neurological: She is alert and oriented to person, place, and time. She exhibits normal muscle tone. Coordination normal.  Sensation to light touch intact in all digits of the left hand. Grip strength normal. Patient able to wiggle all fingers.  Skin: Skin is warm and dry. No rash noted. She is not diaphoretic. No erythema. No pallor.  Psychiatric: She has a normal mood and affect.  Her behavior is normal.  Nursing note and vitals reviewed.    ED Treatments / Results  Labs (all labs ordered are listed, but only abnormal results are displayed) Labs Reviewed  BASIC METABOLIC PANEL - Abnormal; Notable for the following:       Result Value   Glucose, Bld 103 (*)    All other components within normal limits  CBC  I-STAT TROPOININ, ED    EKG  EKG Interpretation None       Radiology Dg Chest 2 View  Result Date: 03/30/2016 CLINICAL DATA:  Chest/left arm pain EXAM: CHEST  2 VIEW COMPARISON:  None. FINDINGS: Lungs are clear.  No pleural effusion or pneumothorax. The heart is normal in size. Visualized osseous structures are within normal limits. IMPRESSION: Normal chest radiographs. Electronically Signed   By: Charline BillsSriyesh  Krishnan M.D.   On: 03/30/2016 22:38    Procedures Procedures (including critical care time)  Medications Ordered in ED Medications  enoxaparin (LOVENOX) injection 90 mg (90 mg Subcutaneous Given 03/31/16 0042)     Initial Impression / Assessment and Plan / ED Course  I have reviewed the triage vital signs and the nursing notes.  Pertinent labs & imaging results that were available during my care of the patient were reviewed by me and considered in my medical decision making (see chart for details).     41 year old female patient is to the emergency department for evaluation of left arm pain. Pain originates in her left hand and radiates to her left shoulder and left chest wall. Patient reports similar chronic pain over the past year after her hand got stuck in a piece of machinery at work. She has been followed extensively by orthopedics for this with history of normal nerve conduction studies.  Patient with no significant edema to her upper extremity. She is diffusely tender. There is no focal tenderness or crepitus. She has no risk factors for heart disease and a reassuring cardiac workup. Heart score is 0. I do not believe the patient's  symptoms today represent ACS. Also doubt pulmonary embolus; the patient is PERC negative. Symptoms most consistent with radicular pain. Per past orthopedic notes, patient has been referred to pain management for additional pain control. She states that she has not been contacted by a pain clinic yet.  Patient with history of negative x-rays of her extremity. She has not had an ultrasound performed. Plan to have patient return for upper extremity venous duplex to ensure no blood clot. The patient has been treated with one dose of Lovenox in the ED. She has been instructed to return for this study in the morning. Otherwise, supportive care is indicated. Return precautions discussed and provided. Patient discharged in satisfactory condition with no unaddressed concerns.   Final Clinical Impressions(s) / ED Diagnoses   Final diagnoses:  Left arm pain    New Prescriptions Discharge Medication List as of 03/31/2016  1:12 AM  START taking these medications   Details  traMADol (ULTRAM) 50 MG tablet Take 1 tablet (50 mg total) by mouth every 6 (six) hours as needed for moderate pain or severe pain., Starting Sun 03/31/2016, Print         Antony Madura, PA-C 03/31/16 0547    Layla Maw Ward, DO 03/31/16 [phone]

## 2016-04-24 ENCOUNTER — Ambulatory Visit (INDEPENDENT_AMBULATORY_CARE_PROVIDER_SITE_OTHER): Payer: Self-pay | Admitting: Physician Assistant

## 2016-05-23 ENCOUNTER — Encounter (INDEPENDENT_AMBULATORY_CARE_PROVIDER_SITE_OTHER): Payer: Self-pay | Admitting: Physician Assistant

## 2016-05-23 ENCOUNTER — Ambulatory Visit (INDEPENDENT_AMBULATORY_CARE_PROVIDER_SITE_OTHER): Payer: Self-pay | Admitting: Physician Assistant

## 2016-05-23 VITALS — BP 127/86 | HR 62 | Temp 98.0°F | Ht 64.76 in | Wt 196.4 lb

## 2016-05-23 DIAGNOSIS — G43911 Migraine, unspecified, intractable, with status migrainosus: Secondary | ICD-10-CM

## 2016-05-23 MED ORDER — SUMATRIPTAN SUCCINATE 25 MG PO TABS: 25.0000 mg | ORAL_TABLET | Freq: Every day | ORAL | 0 refills | Status: DC

## 2016-05-23 MED ORDER — AMITRIPTYLINE HCL 10 MG PO TABS: 10.0000 mg | ORAL_TABLET | Freq: Every day | ORAL | 1 refills | Status: DC

## 2016-05-23 NOTE — Patient Instructions (Signed)
Cefalea migraosa  (Migraine Headache)  Una cefalea migraosa es un dolor intenso y punzante en uno o ambos lados de la cabeza. Las migraas tambin pueden causar otros sntomas, como nuseas, vmitos y sensibilidad a la luz y el ruido.  CAUSAS  Hay ciertos factores que pueden provocar migraas, como los siguientes:   Alcohol.   Fumar.   Medicamentos, por ejemplo:  ? Medicamentos para aliviar el dolor torcico (nitroglicerina).  ? Pldoras anticonceptivas.  ? Comprimidos de estrgeno.  ? Ciertos medicamentos para la presin arterial.   Quesos curados, chocolate o cafena.   Los alimentos o las bebidas que contienen nitratos, glutamato, aspartamo o tiramina.   Realizar actividad fsica.  Otros factores que pueden provocar migraa incluyen los siguientes:   Menstruacin.   Embarazo.   Hambre.   Estrs, poco o demasiado sueo, o fatiga.   Cambios climticos.  FACTORES DE RIESGO  Los siguientes factores pueden hacer que usted sea ms propenso a tener migraas:   La edad. Los riesgos aumentan con la edad.   Antecedentes familiares de migraa.   Ser de raza caucsica.   Depresin y ansiedad.   Obesidad.   Ser mujer.   Tener un agujero en el corazn (persistencia del agujero oval) u otros problemas cardacos.  SNTOMAS  El principal sntoma de esta afeccin es el dolor intenso y punzante. El dolor:   Puede aparecer en cualquier regin de la cabeza, tanto de un lado como de ambos.   Puede interferir con las actividades de la vida cotidiana.   Puede empeorar con la actividad fsica.   Puede empeorar ante la exposicin a luces brillantes o a ruidos fuertes.  Otros sntomas pueden incluir lo siguiente:   Nuseas.   Vmitos.   Mareos.   Sensibilidad general a las luces brillantes, a los ruidos fuertes o a los olores.  Antes de sufrir una migraa, puede percibir seales de advertencia (aura). Un aura puede incluir:   Ver luces intermitentes o tener puntos ciegos.   Ver puntos brillantes, halos o  lneas en zigzag.   Tener una visin en tnel o visin borrosa.   Sentir entumecimiento u hormigueo.   Tener dificultad para hablar.   Debilidad muscular.  DIAGNSTICO  La cefalea migraosa se diagnostica en funcin de lo siguiente:   Sus sntomas.   Un examen fsico.   Estudios, como una tomografa computarizada o una resonancia magntica de la cabeza. Estos estudios de diagnstico por imgenes pueden ayudar a descartar otras causas de cefalea.   Una muestra de lquido cefalorraqudeo (puncin lumbar) para analizar (anlisis de lquido cefalorraqudeo o anlisis de LCR).  TRATAMIENTO  Las cefaleas migraosas suelen tratarse con medicamentos que:   Alivian el dolor.   Alivian las nuseas.   Evitan la recurrencia de las migraas.  El tratamiento tambin puede incluir lo siguiente:   Acupuntura.   Cambios en el estilo de vida, como evitar alimentos que provoquen migraa.  INSTRUCCIONES PARA EL CUIDADO EN EL HOGAR  Medicamentos   Tome los medicamentos de venta libre y los recetados solamente como se lo haya indicado el mdico.   No conduzca ni use maquinaria pesada mientras toma analgsicos recetados.   A fin de prevenir o tratar el estreimiento mientras toma analgsicos recetados, el mdico puede recomendarle lo siguiente:  ? Beba suficiente lquido para mantener la orina clara o de color amarillo plido.  ? Tomar medicamentos recetados o de venta libre.  ? Consumir alimentos ricos en fibra, como frutas y verduras frescas, cereales integrales   y frijoles.  ? Limitar el consumo de alimentos con alto contenido de grasas y azcares procesados, como alimentos fritos o dulces.  Estilo de vida   Evite el consumo de alcohol.   No consuma ningn producto que contenga nicotina o tabaco, como cigarrillos y cigarrillos electrnicos. Si necesita ayuda para dejar de fumar, consulte al mdico.   Duerma como mnimo 8horas todas las noches.   Evite las situaciones de estrs.  Instrucciones generales   Lleve un  registro diario para averiguar lo que puede provocar las cefaleas migraosas. Por ejemplo, escriba:  ? Lo que usted come y bebe.  ? Cunto tiempo duerme.  ? Algn cambio en su dieta o en los medicamentos.   Si tiene una migraa:  ? Evite los factores que empeoren los sntomas, como las luces brillantes.  ? Resulta til acostarse en una habitacin oscura y silenciosa.  ? No conduzca vehculos ni opere maquinaria pesada.  ? Pregntele al mdico qu actividades son seguras para usted cuando tiene sntomas.   Concurra a todas las visitas de control como se lo haya indicado el mdico. Esto es importante.  SOLICITE ATENCIN MDICA SI:   Tiene sntomas de migraa distintos o ms intensos que los habituales.    SOLICITE ATENCIN MDICA DE INMEDIATO SI:   La migraa se hace cada vez ms intensa.   Tiene fiebre.   Presenta rigidez en el cuello.   Tiene prdida de visin.   Siente debilidad en los msculos o que no puede controlarlos.   Comienza a perder el equilibrio con frecuencia.   Comienza a tener dificultades para caminar.   Se desmaya.    Esta informacin no tiene como fin reemplazar el consejo del mdico. Asegrese de hacerle al mdico cualquier pregunta que tenga.  Document Released: 02/04/2005 Document Revised: 11/25/2012 Document Reviewed: 07/24/2015  Elsevier Interactive Patient Education  2017 [employer]

## 2016-05-23 NOTE — Progress Notes (Signed)
Subjective:  Patient ID: Carmen Pierce, female    DOB: 1975-11-05  Age: 41 y.o. MRN: [id number]  CC:  headache   HPI Carmen Pierce is a 41 y.o. female with a PMH of right posterior auricular cyst and GERD presents for headache x2 weeks. Onset 4 years ago. Last episode two weeks ago but is beginning to feel one today. Left sided headache only. Associated light headedness, photophobia, phonophobia. Alleviated with rest in a dark room. Tylenol helps sometimes. Does not endorse any other symptoms. MR head/brain 2009 showed no acute intracranial abnormality.   Review of Systems  Constitutional: Negative for chills, fever and malaise/fatigue.  Eyes: Negative for blurred vision.  Respiratory: Negative for shortness of breath.   Cardiovascular: Negative for chest pain and palpitations.  Gastrointestinal: Negative for abdominal pain and nausea.  Genitourinary: Negative for dysuria and hematuria.  Musculoskeletal: Negative for joint pain and myalgias.  Skin: Negative for rash.  Neurological: Positive for headaches. Negative for tingling.  Psychiatric/Behavioral: Negative for depression. The patient is not nervous/anxious.     Objective:  BP 127/86 (BP Location: Right Arm, Patient Position: Sitting, Cuff Size: Large)   Pulse 62   Temp 98 F (36.7 C) (Oral)   Ht 5' 4.76" (1.645 m)   Wt 196 lb 6.4 oz (89.1 kg)   LMP 05/21/2016 (Exact Date)   SpO2 100%   BMI 32.92 kg/m   BP/Weight 05/23/2016 03/31/2016 03/08/2015  Systolic BP 127 115 125  Diastolic BP 86 71 80  Wt. (Lbs) 196.4 - 199.6  BMI 32.92 - 32.7      Physical Exam  Constitutional: She is oriented to person, place, and time.  Well developed, overweight, NAD, polite  HENT:  Head: Normocephalic and atraumatic.  Uvula midline, oropharynx unremarkable.  Eyes: EOM are normal. Pupils are equal, round, and reactive to light. No scleral icterus.  Neck: Normal range of motion. Neck supple. No thyromegaly present.  Cardiovascular:  Normal rate, regular rhythm and normal heart sounds.   Pulmonary/Chest: Effort normal and breath sounds normal.  Musculoskeletal: She exhibits no edema.  Neurological: She is alert and oriented to person, place, and time. No cranial nerve deficit. Coordination normal.  Skin: Skin is warm and dry. No rash noted. No erythema. No pallor.  Psychiatric: She has a normal mood and affect. Her behavior is normal. Thought content normal.  Vitals reviewed.    Assessment & Plan:   1. Intractable migraine with status migrainosus, unspecified migraine type - CBC with Differential/Platelet - Comprehensive metabolic panel - TSH - amitriptyline (ELAVIL) 10 MG tablet; Take 1 tablet (10 mg total) by mouth at bedtime.  Dispense: 30 tablet; Refill: 1 - SUMAtriptan (IMITREX) 25 MG tablet; Take 1 tablet (25 mg total) by mouth daily. May take one more tablet two hours after the first. No more than two tablets per day.  Dispense: 30 tablet; Refill: 0   Meds ordered this encounter  Medications  . amitriptyline (ELAVIL) 10 MG tablet    Sig: Take 1 tablet (10 mg total) by mouth at bedtime.    Dispense:  30 tablet    Refill:  1    Order Specific Question:   Supervising Provider    Answer:   Quentin Angst [id number]  . SUMAtriptan (IMITREX) 25 MG tablet    Sig: Take 1 tablet (25 mg total) by mouth daily. May take one more tablet two hours after the first. No more than two tablets per day.    Dispense:  30 tablet  Refill:  0    Order Specific Question:   Supervising Provider    Answer:   Quentin Angst [[id number]]    Follow-up: Return in about 4 weeks (around 06/20/2016) for migraine.   Loletta Specter PA

## 2016-05-23 NOTE — Progress Notes (Signed)
New patient presents with headaches  Pt denies any pain today

## 2016-05-24 LAB — COMPREHENSIVE METABOLIC PANEL
ALBUMIN: 4.6 g/dL (ref 3.5–5.5)
ALK PHOS: 80 IU/L (ref 39–117)
ALT: 23 IU/L (ref 0–32)
AST: 19 IU/L (ref 0–40)
Albumin/Globulin Ratio: 1.5 (ref 1.2–2.2)
BUN / CREAT RATIO: 15 (ref 9–23)
BUN: 10 mg/dL (ref 6–20)
CHLORIDE: 101 mmol/L (ref 96–106)
CO2: 24 mmol/L (ref 18–29)
Calcium: 9.6 mg/dL (ref 8.7–10.2)
Creatinine, Ser: 0.66 mg/dL (ref 0.57–1.00)
GFR calc Af Amer: 129 mL/min/{1.73_m2} (ref >59)
GFR calc non Af Amer: 112 mL/min/{1.73_m2} (ref >59)
GLOBULIN, TOTAL: 3 g/dL (ref 1.5–4.5)
Glucose: 97 mg/dL (ref 65–99)
POTASSIUM: 4.4 mmol/L (ref 3.5–5.2)
SODIUM: 139 mmol/L (ref 134–144)
Total Protein: 7.6 g/dL (ref 6.0–8.5)

## 2016-05-24 LAB — CBC WITH DIFFERENTIAL/PLATELET
BASOS ABS: 0 10*3/uL (ref 0.0–0.2)
Basos: 0 %
EOS (ABSOLUTE): 0.1 10*3/uL (ref 0.0–0.4)
Eos: 2 %
HEMATOCRIT: 37.2 % (ref 34.0–46.6)
HEMOGLOBIN: 12.6 g/dL (ref 11.1–15.9)
Immature Grans (Abs): 0 10*3/uL (ref 0.0–0.1)
Immature Granulocytes: 0 %
LYMPHS ABS: 2.5 10*3/uL (ref 0.7–3.1)
Lymphs: 39 %
MCH: 31.4 pg (ref 26.6–33.0)
MCHC: 33.9 g/dL (ref 31.5–35.7)
MCV: 93 fL (ref 79–97)
Monocytes Absolute: 0.4 10*3/uL (ref 0.1–0.9)
Monocytes: 5 %
NEUTROS ABS: 3.5 10*3/uL (ref 1.4–7.0)
Neutrophils: 54 %
Platelets: 265 10*3/uL (ref 150–379)
RBC: 4.01 x10E6/uL (ref 3.77–5.28)
RDW: 13.4 % (ref 12.3–15.4)
WBC: 6.4 10*3/uL (ref 3.4–10.8)

## 2016-05-24 LAB — TSH: TSH: 3.44 u[IU]/mL (ref 0.450–4.500)

## 2016-06-24 ENCOUNTER — Ambulatory Visit (INDEPENDENT_AMBULATORY_CARE_PROVIDER_SITE_OTHER): Payer: Self-pay

## 2016-07-05 ENCOUNTER — Ambulatory Visit (INDEPENDENT_AMBULATORY_CARE_PROVIDER_SITE_OTHER): Payer: Self-pay

## 2016-07-12 ENCOUNTER — Ambulatory Visit (INDEPENDENT_AMBULATORY_CARE_PROVIDER_SITE_OTHER): Payer: Self-pay

## 2016-07-17 ENCOUNTER — Ambulatory Visit (INDEPENDENT_AMBULATORY_CARE_PROVIDER_SITE_OTHER): Payer: Self-pay | Admitting: Orthopaedic Surgery

## 2016-07-18 ENCOUNTER — Ambulatory Visit (INDEPENDENT_AMBULATORY_CARE_PROVIDER_SITE_OTHER): Payer: Self-pay | Admitting: Physician Assistant

## 2016-07-18 ENCOUNTER — Encounter (INDEPENDENT_AMBULATORY_CARE_PROVIDER_SITE_OTHER): Payer: Self-pay | Admitting: Physician Assistant

## 2016-07-18 VITALS — BP 101/68 | HR 81 | Temp 98.2°F | Wt 197.0 lb

## 2016-07-18 DIAGNOSIS — R200 Anesthesia of skin: Secondary | ICD-10-CM

## 2016-07-18 DIAGNOSIS — J069 Acute upper respiratory infection, unspecified: Secondary | ICD-10-CM

## 2016-07-18 DIAGNOSIS — G43009 Migraine without aura, not intractable, without status migrainosus: Secondary | ICD-10-CM

## 2016-07-18 MED ORDER — GUAIFENESIN ER 1200 MG PO TB12: 1.0000 | ORAL_TABLET | Freq: Two times a day (BID) | ORAL | 0 refills | Status: AC

## 2016-07-18 MED ORDER — PREDNISONE 20 MG PO TABS: 60.0000 mg | ORAL_TABLET | Freq: Every day | ORAL | 0 refills | Status: DC

## 2016-07-18 MED ORDER — NAPROXEN 500 MG PO TBEC: 500.0000 mg | DELAYED_RELEASE_TABLET | Freq: Two times a day (BID) | ORAL | 0 refills | Status: DC

## 2016-07-18 MED ORDER — TOPIRAMATE 25 MG PO TABS: 25.0000 mg | ORAL_TABLET | Freq: Every day | ORAL | 2 refills | Status: DC

## 2016-07-18 MED FILL — TOPIRAMATE 25 MG TABLET: 25 | 30 days supply | Qty: 30 | Fill #0

## 2016-07-18 MED FILL — predniSONE 20 MG TABS: 20 | 5 days supply | Qty: 15 | Fill #0

## 2016-07-18 NOTE — Patient Instructions (Addendum)
Infeccin del tracto respiratorio superior, adultos (Upper Respiratory Infection, Adult) La mayora de las infecciones del tracto respiratorio superior son infecciones virales de las vas que llevan el aire a los pulmones. Un infeccin del tracto respiratorio superior afecta la nariz, la garganta y las vas respiratorias superiores. El tipo ms frecuente de infeccin del tracto respiratorio superior es la nasofaringitis, que habitualmente se conoce como "resfro comn". Las infecciones del tracto respiratorio superior siguen su curso y por lo general se curan solas. En la International Business Machinesmayora de los casos, la infeccin del tracto respiratorio superior no requiere atencin Clark Millsmdica, Biomedical engineerpero a veces, despus de una infeccin viral, puede surgir una infeccin bacteriana en las vas respiratorias superiores. Esto se conoce como infeccin secundaria. Las infecciones sinusales y en el odo medio son tipos frecuentes de infecciones secundarias en el tracto respiratorio superior. La neumona bacteriana tambin puede complicar un cuadro de infeccin del tracto respiratorio superior. Este tipo de infeccin puede empeorar el asma y la enfermedad pulmonar obstructiva crnica (EPOC). En algunos casos, estas complicaciones pueden requerir atencin mdica de emergencia y poner en peligro la vida. CAUSAS Casi todas las infecciones del tracto respiratorio superior se deben a los virus. Un virus es un tipo de microbio que puede contagiarse de Neomia Dearuna persona a Educational psychologistotra. FACTORES DE RIESGO Puede estar en riesgo de sufrir una infeccin del tracto respiratorio superior si:  Fuma.  Tiene una enfermedad pulmonar o cardaca crnica.  Tiene debilitado el sistema de defensa (inmunitario) del cuerpo.  Es [address] [place] Entrancemuy joven o de edad muy De [place].  Tiene asma o alergias nasales.  Trabaja en reas donde hay mucha gente o poca ventilacin.  Rudi Cocorabaja en una escuela o en un centro de atencin mdica. SIGNOS Y SNTOMAS Habitualmente, los sntomas aparecen de  2a 3das despus de entrar en contacto con el virus del resfro. La mayora de las infecciones virales en el tracto respiratorio superior duran de 7a 10das. Sin embargo, las infecciones virales en el tracto respiratorio superior a causa del virus de la gripe pueden durar de 14a 18das y, habitualmente, son ms graves. Entre los sntomas se pueden incluir los siguientes:  Secrecin o congestin nasal.  Estornudos.  Tos.  Dolor de Advertising copywritergarganta.  Dolor de Turk[place]istancabeza.  Fatiga.  Grant RutsFiebre.  Prdida del apetito.  Dolor en la frente, detrs de los ojos y por encima de los pmulos (dolor sinusal).  Dolores musculares. DIAGNSTICO El mdico puede diagnosticar una infeccin del tracto respiratorio superior mediante los siguientes estudios:  Examen fsico.  Pruebas para verificar si los sntomas no se deben a otra afeccin, por ejemplo: ? Faringitis estreptoccica. ? Sinusitis. ? Neumona. ? Asma. TRATAMIENTO Esta infeccin desaparece sola, con el tiempo. No puede curarse con medicamentos, pero a menudo se prescriben para aliviar los sntomas. Los medicamentos pueden ser tiles para lo siguiente:  Personal assistantBajar la fiebre.  Reducir la tos.  Aliviar la congestin nasal. INSTRUCCIONES PARA EL CUIDADO EN EL HOGAR  Tome los medicamentos solamente como se lo haya indicado el mdico.  A fin de Engineer, materialsaliviar el dolor de garganta, haga grgaras con solucin salina templada o consuma caramelos para la tos, como se lo haya indicado el mdico.  Use un humidificador de vapor clido o inhale el vapor de la ducha para aumentar la humedad del aire. Esto facilitar la respiracin.  Beba suficiente lquido para Photographermantener la orina clara o de color amarillo plido.  Consuma sopas y otros caldos transparentes, y Abbott Laboratoriesalimntese bien.  Descanse todo lo que sea necesario.  Regrese al Merlene Pullingtrabajo cuando  la temperatura se le haya normalizado o cuando el mdico lo autorice. Es posible que deba quedarse en su casa durante un  tiempo prolongado, para no infectar a los dems. Tambin puede usar un barbijo y lavarse las manos con cuidado para Transport planner propagacin del virus.  Aumente el uso del inhalador si tiene asma.  No consuma ningn producto que contenga tabaco, lo que incluye cigarrillos, tabaco de Theatre manager o Administrator, Civil Service. Si necesita ayuda para dejar de fumar, consulte al [employer].  PREVENCIN La mejor manera de protegerse de un resfro es mantener una higiene Dodge.  Evite el contacto oral o fsico con personas que tengan sntomas de resfro.  En caso de contacto, lvese las manos con frecuencia. No hay pruebas claras de que la vitaminaC, la vitaminaE, la equincea o el ejercicio reduzcan la probabilidad de Primary school teacher un resfro. Sin embargo, siempre se recomienda Insurance account manager, hacer ejercicio y Engineering geologist. SOLICITE ATENCIN MDICA SI:  Su estado empeora en lugar de mejorar.  Los medicamentos no Estate agent.  Tiene escalofros.  La sensacin de falta de aire empeora.  Tiene mucosidad marrn o roja.  Tiene secrecin nasal amarilla o marrn.  Le duele la cara, especialmente al inclinarse hacia adelante.  Tiene fiebre.  Tiene los ganglios del cuello hinchados.  Siente dolor al tragar.  Tiene zonas blancas en la parte de atrs de la garganta.  SOLICITE ATENCIN MDICA DE INMEDIATO SI:  Tiene sntomas intensos o persistentes de: ? Dolor de [place]. ? Dolor de odos. ? Dolor sinusal. ? Journalist, newspaper.  Tiene enfermedad pulmonar crnica y cualquiera de estos sntomas: ? Sibilancias. ? Tos prolongada. ? Tos con Montez Hageman. ? Cambio en la mucosidad habitual.  Presenta rigidez en el cuello.  Tiene cambios en: ? La visin. ? La audicin. ? El pensamiento. ? El [place] de nimo.  ASEGRESE DE QUE:  Comprende estas instrucciones.  Controlar su afeccin.  Recibir ayuda de inmediato si no mejora o si empeora.  Esta informacin no tiene Microbiologist el consejo del mdico. Asegrese de hacerle al mdico cualquier pregunta que tenga. Document Released: 11/14/2004 Document Revised: 06/21/2014 Document Reviewed: 05/12/2013 Elsevier Interactive Patient Education  2017 Elsevier Inc.  Sndrome del tnel carpiano (Carpal Tunnel Syndrome) El sndrome del tnel carpiano es una afeccin que causa dolor en la mano y en el brazo. El tnel carpiano es un espacio estrecho ubicado en el lado palmar de la [place]. Los movimientos de la [place] o ciertas enfermedades pueden causar hinchazn del tnel. Esta hinchazn comprime el nervio principal de la mueca (nervio mediano). CAUSAS Esta afeccin puede ser causada por lo siguiente:  Movimientos repetidos de [employer].  Lesiones en la [place].  Artritis.  Un quiste o un tumor en el tnel carpiano.  Acumulacin de lquido Academic librarian. A veces, se desconoce la causa de esta afeccin. FACTORES DE RIESGO Es ms probable que esta afeccin se manifieste en:  Las personas que tienen trabajos en los que deben Hum[employer] Inc mismos movimientos repetidos de las [place], como los carniceros y los cajeros.  Las mujeres.  Las personas que tienen determinadas enfermedades, por ejemplo: ? Diabetes. ? Obesidad. ? Tiroides hipoactiva (hipotiroidismo). ? Insuficiencia renal. SNTOMAS Los sntomas de esta afeccin incluyen lo siguiente:  Sensacin de hormigueo en los dedos de la mano, Dentist, el ndice y el dedo [place].  Hormigueo o adormecimiento en la mano.  Sensacin de Engineer, mining en todo el brazo, especialmente cuando la [place] y  el codo estn flexionados durante [employer].  Dolor en la mueca que sube por el brazo hasta el hombro.  Dolor que baja por la mano o los dedos.  Sensacin de [employer]. Tal vez tenga dificultad para tomar y Personal assistant. Los sntomas pueden empeorar durante la noche. DIAGNSTICO Esta afeccin se diagnostica mediante la  historia clnica y un examen fsico. Tambin pueden hacerle exmenes, que incluyen los siguientes:  Electromiografa (EMG). Esta prueba mide las seales elctricas que los nervios les envan a los msculos.  Radiografas. TRATAMIENTO El tratamiento de esta afeccin incluye lo siguiente:  Cambios en el estilo de vida. Es importante dejar de Radio producer o modificar la actividad que caus la afeccin.  Fisioterapia o terapia ocupacional.  Analgsicos y antiinflamatorios. Esto puede incluir medicamentos que se inyectan en la [place].  Una frula para la [place].  [place]. INSTRUCCIONES PARA EL CUIDADO EN EL HOGAR Si tiene una frula:  sela como se lo haya indicado el mdico. Qutesela solamente como se lo haya indicado el mdico.  Afloje la frula si los dedos se le entumecen, siente hormigueos o se le enfran y se tornan de Research officer, trade union.  Mantenga la frula limpia y seca. Instrucciones generales  [employer] de venta libre y los recetados solamente como se lo haya indicado el mdico.  Haga reposar la [place] de toda actividad que le cause dolor. Si la afeccin tiene relacin con Kathie Dike, hable con su empleador Tribune Company pueden [place], por [place], usar una almohadilla para apoyar la mueca mientras tipea.  Si se lo indican, aplique hielo sobre la zona dolorida: ? Nature conservation officer hielo en una bolsa plstica. ? Coloque una FirstEnergy Corp piel y la bolsa de hielo. ? Coloque el hielo durante , 2 a 3veces por da.  Concurra a todas las visitas de control como se lo haya indicado el mdico. Esto es importante.  Haga los ejercicios como se lo hayan indicado el mdico, el fisioterapeuta o el terapeuta ocupacional. SOLICITE ATENCIN MDICA SI:  Aparecen nuevos sntomas.  El dolor no se alivia con los [employer].  Los sntomas empeoran.  Esta informacin no tiene Theme park manager el consejo del mdico. Asegrese de hacerle al mdico cualquier pregunta que  tenga. Document Released: 02/04/2005 Document Revised: 05/29/2015 Document Reviewed: 06/22/2014 Elsevier Interactive Patient Education  2017 [employer]itor.

## 2016-07-18 NOTE — Progress Notes (Signed)
Subjective:  Patient ID: Carmen Pierce, female    DOB: August 17, 1975  Age: 41 y.o. MRN: [id number]  CC: f/u migraines  HPI Chan Carmen Pierce is a 41 y.o. female with a PMH of migraine headaches presents on f/u of headache. She was prescribed Amitrityline and sumatriptan. Took only three pills of each. Says Amitrytpline made her dizzy but seemed to work in preventing her migraines. Currently has a migraine rated 3/10.    Complains of cough. Onset of cough, mild sore throat, and nasal congestion 5 days ago. Has not taken anything for relief. No close contacts with the same. Cough worse at night. Clearing throat throughout the day. Denies f/c/n/v, rash, abdominal pain, chest pain, SOB, wheezing, or GI/GU symptoms. *   Patient complains of left hand pain and numbness. Pain radiates proximally to the left forearm and distally to the fingers. Does not know if 5th digit is also involved. Pain worse with activity using the left hand. Denies cyanosis or paralysis.   ROS Review of Systems  Constitutional: Negative for chills, fever and malaise/fatigue.  HENT: Positive for congestion and sore throat.   Eyes: Negative for blurred vision.  Respiratory: Positive for cough. Negative for shortness of breath.   Cardiovascular: Negative for chest pain and palpitations.  Gastrointestinal: Negative for abdominal pain and nausea.  Genitourinary: Negative for dysuria and hematuria.  Musculoskeletal: Negative for joint pain and myalgias.       Left hand pain  Skin: Negative for rash.  Neurological: Positive for headaches.       Tingling and numbness of the left hand  Psychiatric/Behavioral: Negative for depression. The patient is not nervous/anxious.     Objective:  BP 101/68 (BP Location: Left Arm, Patient Position: Sitting, Cuff Size: Large)   Pulse 81   Temp 98.2 F (36.8 C) (Oral)   Wt 197 lb (89.4 kg)   LMP 06/20/2016 (Exact Date)   SpO2 94%   BMI 33.02 kg/m   BP/Weight 07/18/2016 05/23/2016 03/31/2016   Systolic BP 101 127 115  Diastolic BP 68 86 71  Wt. (Lbs) 197 196.4 -  BMI 33.02 32.92 -      Physical Exam  Constitutional: She is oriented to person, place, and time.  Well developed, well nourished, NAD, polite  HENT:  Head: Normocephalic and atraumatic.  Tonsils 1+, no erythema, no exudates. TMs normal. No sinus TTP.  Eyes: Conjunctivae are normal. Pupils are equal, round, and reactive to light. No scleral icterus.  Neck: Normal range of motion. Neck supple.  Cardiovascular: Normal rate, regular rhythm and normal heart sounds.   Pulmonary/Chest: Effort normal and breath sounds normal.  Musculoskeletal: She exhibits no edema.  Lymphadenopathy:    She has no cervical adenopathy.  Neurological: She is alert and oriented to person, place, and time. No cranial nerve deficit. Coordination normal.  Positive Tinel's and Phalen's.  Skin: Skin is warm and dry. No rash noted. No erythema. No pallor.  Psychiatric: She has a normal mood and affect. Her behavior is normal. Thought content normal.  Vitals reviewed.    Assessment & Plan:   1. Migraine without aura and without status migrainosus, not intractable - Begin topiramate (TOPAMAX) 25 MG tablet; Take 1 tablet (25 mg total) by mouth daily.  Dispense: 30 tablet; Refill: 2 - Stop Amitriptyline   2. Viral upper respiratory tract infection - Begin Guaifenesin (MUCINEX MAXIMUM STRENGTH) 1200 MG TB12; Take 1 tablet (1,200 mg total) by mouth 2 (two) times daily.  Dispense: 10 tablet; Refill: 0 -  Begin naproxen (EC NAPROSYN) 500 MG EC tablet; Take 1 tablet (500 mg total) by mouth 2 (two) times daily with a meal.  Dispense: 14 tablet; Refill: 0  3. Numbness of left hand - Begin predniSONE (DELTASONE) 20 MG tablet; Take 3 tablets (60 mg total) by mouth daily with breakfast.  Dispense: 15 tablet; Refill: 0 - Ambulatory referral to Neurology * Patient presented a stack of her previous progress notes after I discharged her. She wanted to  address left hand pain and numbness also.   Meds ordered this encounter  Medications  . topiramate (TOPAMAX) 25 MG tablet    Sig: Take 1 tablet (25 mg total) by mouth daily.    Dispense:  30 tablet    Refill:  2    Order Specific Question:   Supervising Provider    Answer:   Quentin Angst [id number]  . Guaifenesin (MUCINEX MAXIMUM STRENGTH) 1200 MG TB12    Sig: Take 1 tablet (1,200 mg total) by mouth 2 (two) times daily.    Dispense:  10 tablet    Refill:  0    Order Specific Question:   Supervising Provider    Answer:   Quentin Angst [id number]  . naproxen (EC NAPROSYN) 500 MG EC tablet    Sig: Take 1 tablet (500 mg total) by mouth 2 (two) times daily with a meal.    Dispense:  14 tablet    Refill:  0    Order Specific Question:   Supervising Provider    Answer:   Quentin Angst [id number]  . predniSONE (DELTASONE) 20 MG tablet    Sig: Take 3 tablets (60 mg total) by mouth daily with breakfast.    Dispense:  15 tablet    Refill:  0    Order Specific Question:   Supervising Provider    Answer:   Quentin Angst [id number]    Follow-up: Return if symptoms worsen or fail to improve.   Loletta Specter PA

## 2016-07-23 ENCOUNTER — Ambulatory Visit (INDEPENDENT_AMBULATORY_CARE_PROVIDER_SITE_OTHER): Payer: Self-pay | Admitting: Orthopaedic Surgery

## 2016-09-09 ENCOUNTER — Ambulatory Visit: Payer: Self-pay | Admitting: Diagnostic Neuroimaging

## 2016-09-10 ENCOUNTER — Encounter: Payer: Self-pay | Admitting: Diagnostic Neuroimaging

## 2016-11-01 ENCOUNTER — Ambulatory Visit (INDEPENDENT_AMBULATORY_CARE_PROVIDER_SITE_OTHER): Payer: Self-pay | Admitting: Physician Assistant

## 2016-11-05 ENCOUNTER — Encounter (INDEPENDENT_AMBULATORY_CARE_PROVIDER_SITE_OTHER): Payer: Self-pay | Admitting: Physician Assistant

## 2016-11-05 ENCOUNTER — Ambulatory Visit (INDEPENDENT_AMBULATORY_CARE_PROVIDER_SITE_OTHER): Payer: Self-pay | Admitting: Physician Assistant

## 2016-11-05 VITALS — BP 116/76 | HR 76 | Temp 98.4°F | Wt 198.4 lb

## 2016-11-05 DIAGNOSIS — R1013 Epigastric pain: Secondary | ICD-10-CM

## 2016-11-05 DIAGNOSIS — R1012 Left upper quadrant pain: Secondary | ICD-10-CM

## 2016-11-05 DIAGNOSIS — R140 Abdominal distension (gaseous): Secondary | ICD-10-CM

## 2016-11-05 MED ORDER — OMEPRAZOLE 40 MG PO CPDR: 40.0000 mg | DELAYED_RELEASE_CAPSULE | Freq: Every day | ORAL | 3 refills | Status: DC

## 2016-11-05 MED FILL — OMEPRAZOLE DR 40 MG CAPSULE: 40 | 30 days supply | Qty: 30 | Fill #0

## 2016-11-05 NOTE — Progress Notes (Signed)
Pt complains of pain in her ribs on the left side, last week pain was a 10 today only about a 2 or 3

## 2016-11-05 NOTE — Progress Notes (Signed)
Subjective:  Patient ID: Carmen Pierce, female    DOB: May 18, 1975  Age: 41 y.o. MRN: [id number]  CC: left upper quadrant pain  HPI Carmen Pierce is a 41 y.o. female with a medical history of   Presents with LUQ abdominal pain since two weeks ago. There is associated early satiety, nausea, epigastric "burning". Has not taken anything for relief.  Denies melena, BRBPR, constipation, diarrhea, fever, chills, vomiting.    Outpatient Medications Prior to Visit  Medication Sig Dispense Refill  . naproxen (EC NAPROSYN) 500 MG EC tablet Take 1 tablet (500 mg total) by mouth 2 (two) times daily with a meal. (Patient not taking: Reported on 11/05/2016) 14 tablet 0  . predniSONE (DELTASONE) 20 MG tablet Take 3 tablets (60 mg total) by mouth daily with breakfast. (Patient not taking: Reported on 11/05/2016) 15 tablet 0  . SUMAtriptan (IMITREX) 25 MG tablet Take 1 tablet (25 mg total) by mouth daily. May take one more tablet two hours after the first. No more than two tablets per day. (Patient not taking: Reported on 11/05/2016) 30 tablet 0  . topiramate (TOPAMAX) 25 MG tablet Take 1 tablet (25 mg total) by mouth daily. (Patient not taking: Reported on 11/05/2016) 30 tablet 2   No facility-administered medications prior to visit.      ROS Review of Systems  Constitutional: Negative for chills, fever and malaise/fatigue.  Eyes: Negative for blurred vision.  Respiratory: Negative for shortness of breath.   Cardiovascular: Negative for chest pain and palpitations.  Gastrointestinal: Positive for abdominal pain and nausea. Negative for blood in stool, constipation, diarrhea, melena and vomiting.  Genitourinary: Negative for dysuria and hematuria.  Musculoskeletal: Negative for joint pain and myalgias.  Skin: Negative for rash.  Neurological: Negative for tingling and headaches.  Psychiatric/Behavioral: Negative for depression. The patient is not nervous/anxious.     Objective:  BP 116/76 (BP  Location: Left Arm, Patient Position: Sitting, Cuff Size: Large)   Pulse 76   Temp 98.4 F (36.9 C) (Oral)   Wt 198 lb 6.4 oz (90 kg)   LMP 10/21/2016 (Exact Date)   SpO2 98%   BMI 33.26 kg/m   BP/Weight 11/05/2016 07/18/2016 05/23/2016  Systolic BP 116 101 127  Diastolic BP 76 68 86  Wt. (Lbs) 198.4 197 196.4  BMI 33.26 33.02 32.92      Physical Exam  Constitutional: She is oriented to person, place, and time.  Well developed, obese, NAD, polite  HENT:  Head: Normocephalic and atraumatic.  Eyes: No scleral icterus.  Neck: Normal range of motion. Neck supple. No thyromegaly present.  Cardiovascular: Normal rate, regular rhythm and normal heart sounds.   Pulmonary/Chest: Effort normal and breath sounds normal.  Abdominal: Soft. Bowel sounds are normal. There is tenderness (epigastric and LUQ TTP).  Musculoskeletal: She exhibits no edema.  Neurological: She is alert and oriented to person, place, and time. No cranial nerve deficit. Coordination normal.  Skin: Skin is warm and dry. No rash noted. No erythema. No pallor.  Psychiatric: She has a normal mood and affect. Her behavior is normal. Thought content normal.  Vitals reviewed.    Assessment & Plan:   1. Abdominal pain, epigastric - H. pylori antibody, IgG - Begin omeprazole (PRILOSEC) 40 MG capsule; Take 1 capsule (40 mg total) by mouth daily.  Dispense: 30 capsule; Refill: 3 - CBC with Differential - Comprehensive metabolic panel - Lipase  2. Abdominal pain, left upper quadrant - Begin omeprazole (PRILOSEC) 40 MG capsule; Take 1  capsule (40 mg total) by mouth daily.  Dispense: 30 capsule; Refill: 3  3. Abdominal bloating    Meds ordered this encounter  Medications  . omeprazole (PRILOSEC) 40 MG capsule    Sig: Take 1 capsule (40 mg total) by mouth daily.    Dispense:  30 capsule    Refill:  3    Order Specific Question:   Supervising Provider    Answer:   Quentin Angst [id number]    Follow-up:  Return in about 4 weeks (around 12/03/2016) for f/u abdominal pain.   Loletta Specter PA

## 2016-11-05 NOTE — Patient Instructions (Signed)
Infeccin por Helicobacter Pylori (Helicobacter Pylori Infection) La infeccin por Helicobacter pylori es una infeccin en el estmago que es causada por la bacteria Helicobacter pylori (H. pylori). Este tipo de bacteria vive frecuentemente en el revestimiento del estmago. La infeccin puede causar lceras e irritacin (gastritis) en algunas personas. Es la causa ms comn de lceras en el estmago (lcera gstrica) y en la parte superior del intestino (lcera duodenal). Tener esta infeccin tambin puede aumentar el riesgo de cncer de estmago y un tipo de cncer de los glbulos blancos (linfoma) que afecta al estmago. CAUSAS H. pylori es un tipo de bacteria que se encuentra frecuentemente en el estmago de las personas saludables. La bacteria puede pasar de una persona a otra por contacto a travs de las heces o la saliva. No se sabe por qu algunas personas desarrollan lceras, gastritis o cncer a partir de la infeccin. FACTORES DE RIESGO Es ms probable que esta afeccin se manifieste en las personas que:  Tienen familiares con esta infeccin.  Viven con muchas otras personas; por ejemplo, en un dormitorio.  Son de origen africano, hispano o asitico. SNTOMAS La mayora de las personas con esta infeccin no tienen sntomas. Si tiene sntomas, estos pueden incluir los siguientes:  Acidez estomacal.  Dolor de estmago.  Nuseas.  Vmitos.  Sangre en el vmito.  Prdida del apetito.  Mal aliento. DIAGNSTICO Esta afeccin se puede diagnosticar en funcin de los sntomas, un examen fsico y diferentes pruebas. Podrn solicitarle otros estudios, por ejemplo:  Anlisis de sangre o pruebas de materia fecal para verificar las protenas (anticuerpos) que el cuerpo puede producir en respuesta a las bacterias. Estas pruebas son la mejor manera de confirmar el diagnstico.  Una prueba de aliento para verificar el tipo de gas que la bacteria H. pylori libera despus de descomponer una  sustancia llamada urea. Para la prueba, se le pide que beba urea. Esta prueba se realiza frecuentemente despus del tratamiento para saber si el tratamiento funcion.  Un procedimiento en el que un tubo delgado y flexible con una pequea cmara en el extremo se coloca en el estmago y el intestino superior (endoscopia superior). El mdico tambin puede tomar muestras de tejido (biopsia) para realizar pruebas de H. pylori y cncer. TRATAMIENTO El tratamiento para esta afeccin generalmente implica tomar una combinacin de medicamentos (terapia triple) durante un par de semanas. La terapia triple incluye un medicamento para reducir el cido en el estmago y dos tipos de antibiticos. Se aprobaron muchas combinaciones de frmacos para el tratamiento. El tratamiento generalmente mata a la H. pylori y reduce el riesgo de cncer. Despus del tratamiento, es posible que deba volver a realizarse una prueba de H. pylori. En algunos casos, es posible que sea necesario repetir el tratamiento. INSTRUCCIONES PARA EL CUIDADO EN EL HOGAR  Tome los medicamentos de venta libre y los recetados solamente como se lo haya indicado el mdico.  Tome los antibiticos como se lo haya indicado el mdico. No deje de tomar los antibiticos aunque comience a sentirse mejor.  Puede retomar todas sus actividades habituales y continuar su dieta habitual.  Tome estas medidas para evitar infecciones futuras: ? Lvese las manos con frecuencia. ? Asegrese de que los alimentos que consume se hayan preparado adecuadamente. ? Beba agua solamente de fuentes limpias.  Concurra a todas las visitas de control como se lo haya indicado el mdico. Esto es importante. SOLICITE ATENCIN MDICA SI:  Los sntomas no mejoran.  Los sntomas regresan despus del tratamiento. Esta   informacin no tiene como fin reemplazar el consejo del mdico. Asegrese de hacerle al mdico cualquier pregunta que tenga. Document Released: 05/29/2015 Document  Revised: 05/29/2015 Document Reviewed: 02/16/2014 Elsevier Interactive Patient Education  2018 Elsevier Inc.  

## 2016-11-06 ENCOUNTER — Other Ambulatory Visit (INDEPENDENT_AMBULATORY_CARE_PROVIDER_SITE_OTHER): Payer: Self-pay | Admitting: Physician Assistant

## 2016-11-06 DIAGNOSIS — A048 Other specified bacterial intestinal infections: Secondary | ICD-10-CM

## 2016-11-06 LAB — COMPREHENSIVE METABOLIC PANEL
ALT: 19 IU/L (ref 0–32)
AST: 18 IU/L (ref 0–40)
Albumin/Globulin Ratio: 1.5 (ref 1.2–2.2)
Albumin: 4.2 g/dL (ref 3.5–5.5)
Alkaline Phosphatase: 71 IU/L (ref 39–117)
BUN/Creatinine Ratio: 12 (ref 9–23)
BUN: 8 mg/dL (ref 6–20)
Bilirubin Total: <0.2 mg/dL (ref 0.0–1.2)
CO2: 23 mmol/L (ref 20–29)
CREATININE: 0.66 mg/dL (ref 0.57–1.00)
Calcium: 9.2 mg/dL (ref 8.7–10.2)
Chloride: 106 mmol/L (ref 96–106)
GFR calc Af Amer: 129 mL/min/{1.73_m2} (ref >59)
GFR calc non Af Amer: 112 mL/min/{1.73_m2} (ref >59)
GLUCOSE: 97 mg/dL (ref 65–99)
Globulin, Total: 2.8 g/dL (ref 1.5–4.5)
Potassium: 4.5 mmol/L (ref 3.5–5.2)
SODIUM: 140 mmol/L (ref 134–144)
Total Protein: 7 g/dL (ref 6.0–8.5)

## 2016-11-06 LAB — CBC WITH DIFFERENTIAL/PLATELET
BASOS ABS: 0 10*3/uL (ref 0.0–0.2)
Basos: 0 %
EOS (ABSOLUTE): 0.2 10*3/uL (ref 0.0–0.4)
Eos: 3 %
Hematocrit: 35.9 % (ref 34.0–46.6)
Hemoglobin: 12.4 g/dL (ref 11.1–15.9)
IMMATURE GRANULOCYTES: 0 %
Immature Grans (Abs): 0 10*3/uL (ref 0.0–0.1)
LYMPHS ABS: 1.9 10*3/uL (ref 0.7–3.1)
Lymphs: 26 %
MCH: 32.1 pg (ref 26.6–33.0)
MCHC: 34.5 g/dL (ref 31.5–35.7)
MCV: 93 fL (ref 79–97)
MONOS ABS: 0.4 10*3/uL (ref 0.1–0.9)
Monocytes: 6 %
NEUTROS PCT: 65 %
Neutrophils Absolute: 4.6 10*3/uL (ref 1.4–7.0)
Platelets: 233 10*3/uL (ref 150–379)
RBC: 3.86 x10E6/uL (ref 3.77–5.28)
RDW: 13.4 % (ref 12.3–15.4)
WBC: 7.1 10*3/uL (ref 3.4–10.8)

## 2016-11-06 LAB — LIPASE: LIPASE: 69 U/L (ref 14–72)

## 2016-11-06 LAB — H. PYLORI ANTIBODY, IGG: H PYLORI IGG: 3.01 {index_val} — AB (ref 0.00–0.79)

## 2016-11-06 MED ORDER — AMOXICILLIN 500 MG PO TABS: 1000.0000 mg | ORAL_TABLET | Freq: Two times a day (BID) | ORAL | 0 refills | Status: AC

## 2016-11-06 MED ORDER — CLARITHROMYCIN 500 MG PO TABS: 500.0000 mg | ORAL_TABLET | Freq: Two times a day (BID) | ORAL | 0 refills | Status: AC

## 2016-11-06 MED FILL — AMOXICILLIN 500 MG CAPSULE: 500 | 14 days supply | Qty: 56 | Fill #0

## 2016-11-06 MED FILL — CLARITHROMYCIN 500 MG TAB: 500 | 14 days supply | Qty: 28 | Fill #0

## 2016-11-07 ENCOUNTER — Telehealth (INDEPENDENT_AMBULATORY_CARE_PROVIDER_SITE_OTHER): Payer: Self-pay

## 2016-11-07 NOTE — Telephone Encounter (Signed)
using pacific interpreters, interperter Lars Mage [phone] patient provided with lab results of stomach bacteria and Rx being sent to pharmacy, patient expressed understanding. Maryjean Morn, CMA

## 2016-11-07 NOTE — Telephone Encounter (Signed)
-----   Message from Loletta Specter, PA-C sent at 11/06/2016  1:37 PM EDT ----- Patient has stomach bacteria. I will send out abx now. Rest of labs normal.

## 2016-12-03 ENCOUNTER — Encounter (INDEPENDENT_AMBULATORY_CARE_PROVIDER_SITE_OTHER): Payer: Self-pay | Admitting: Physician Assistant

## 2016-12-03 ENCOUNTER — Ambulatory Visit (INDEPENDENT_AMBULATORY_CARE_PROVIDER_SITE_OTHER): Payer: Self-pay | Admitting: Physician Assistant

## 2016-12-03 VITALS — BP 115/77 | HR 76 | Temp 98.3°F | Wt 196.0 lb

## 2016-12-03 DIAGNOSIS — K2970 Gastritis, unspecified, without bleeding: Secondary | ICD-10-CM

## 2016-12-03 DIAGNOSIS — A048 Other specified bacterial intestinal infections: Secondary | ICD-10-CM

## 2016-12-03 DIAGNOSIS — Z23 Encounter for immunization: Secondary | ICD-10-CM

## 2016-12-03 MED ORDER — OMEPRAZOLE 40 MG PO CPDR: 40.0000 mg | DELAYED_RELEASE_CAPSULE | Freq: Every day | ORAL | 3 refills | Status: DC

## 2016-12-03 NOTE — Progress Notes (Signed)
Subjective:  Patient ID: Carmen Pierce, female    DOB: 06-06-1975  Age: 41 y.o. MRN: [id number]  CC:  F/u abdominal pain   HPI Carmen Pierce is a 41 y.o. female with a medical history of epigastric pain presents to f/u on episgastric pain. She was H pylori IgG positive on 11/05/16. Took dual abx therapy and PPI BID. Reports feeling well and epigastric pain is nearly resolved. Epigastric pain exacerbated at times with certain foods but overall is much better. Does not endorse any other symptoms or complaints.         Outpatient Medications Prior to Visit  Medication Sig Dispense Refill  . omeprazole (PRILOSEC) 40 MG capsule Take 1 capsule (40 mg total) by mouth daily. 30 capsule 3   No facility-administered medications prior to visit.      ROS Review of Systems  Constitutional: Negative for chills, fever and malaise/fatigue.  Eyes: Negative for blurred vision.  Respiratory: Negative for shortness of breath.   Cardiovascular: Negative for chest pain and palpitations.  Gastrointestinal: Negative for abdominal pain and nausea.  Genitourinary: Negative for dysuria and hematuria.  Musculoskeletal: Negative for joint pain and myalgias.  Skin: Negative for rash.  Neurological: Negative for tingling and headaches.  Psychiatric/Behavioral: Negative for depression. The patient is not nervous/anxious.     Objective:  BP 115/77 (BP Location: Left Arm, Patient Position: Sitting, Cuff Size: Large)   Pulse 76   Temp 98.3 F (36.8 C) (Oral)   Wt 196 lb (88.9 kg)   LMP 11/30/2016 (Exact Date)   SpO2 99%   BMI 32.85 kg/m   BP/Weight 12/03/2016 11/05/2016 07/18/2016  Systolic BP 115 116 101  Diastolic BP 77 76 68  Wt. (Lbs) 196 198.4 197  BMI 32.85 33.26 33.02      Physical Exam  Constitutional: She is oriented to person, place, and time.  Well developed, overweight, NAD, polite  HENT:  Head: Normocephalic and atraumatic.  Eyes: No scleral icterus.  Cardiovascular: Normal  rate, regular rhythm and normal heart sounds.   Pulmonary/Chest: Effort normal and breath sounds normal. No respiratory distress. She has no wheezes. She has no rales.  Abdominal: Soft. Bowel sounds are normal. She exhibits no distension and no mass. There is no tenderness. There is no rebound and no guarding.  No hepatosplenomegaly  Musculoskeletal: She exhibits no edema.  Neurological: She is alert and oriented to person, place, and time. No cranial nerve deficit. Coordination normal.  Skin: Skin is warm and dry. No rash noted. No erythema. No pallor.  Psychiatric: She has a normal mood and affect. Her behavior is normal. Thought content normal.  Vitals reviewed.    Assessment & Plan:    1. H. pylori infection - H. pylori Breath Collection; Future - Last PPI 3 days ago. Asked patient to abstain from PPI for 14 days before she does her breath test.   2. Gastritis without bleeding, unspecified chronicity, unspecified gastritis type - Refill omeprazole (PRILOSEC) 40 MG capsule; Take 1 capsule (40 mg total) by mouth daily.  Dispense: 30 capsule; Refill: 3 - Printout given to patient on alternative food choices to alleviate gastritis.  3. Need for Tdap vaccination - Tdap vaccine greater than or equal to 7yo IM  4. Need for prophylactic vaccination and inoculation against influenza - Flu Vaccine QUAD 6+ mos PF IM (Fluarix Quad PF)   Meds ordered this encounter  Medications  . omeprazole (PRILOSEC) 40 MG capsule    Sig: Take 1 capsule (40 mg  total) by mouth daily.    Dispense:  30 capsule    Refill:  3    Order Specific Question:   Supervising Provider    Answer:   Quentin Angst [id number]    Follow-up: Return if symptoms worsen or fail to improve.   Loletta Specter PA

## 2016-12-03 NOTE — Patient Instructions (Signed)
Opciones de alimentos para pacientes con reflujo gastroesofágico - Adultos  (Food Choices for Gastroesophageal Reflux Disease, Adult)  Cuando se tiene reflujo gastroesofágico (ERGE), los alimentos que se ingieren y los hábitos de alimentación son muy importantes. Elegir los alimentos adecuados puede ayudar a aliviar las molestias.  ¿QUÉ PAUTAS DEBO SEGUIR?  · Elija las frutas, los vegetales, los cereales integrales y los productos lácteos con bajo contenido de grasa.  · Elija las carnes de vaca, de pescado y de ave con bajo contenido de grasas.  · Limite las grasas, como los aceites, los aderezos para ensalada, la manteca, los frutos secos y el aguacate.  · Lleve un registro de alimentos. Esto ayuda a identificar los alimentos que ocasionan síntomas.  · Evite los alimentos que le ocasionen síntomas. Pueden ser distintos para cada persona.  · Haga comidas pequeñas durante el día en lugar de 3 comidas abundantes.  · Coma lentamente, en un lugar donde esté distendido.  · Limite el consumo de alimentos fritos.  · Cocine los alimentos utilizando métodos que no sean la fritura.  · Evite el consumo alcohol.  · Evite beber grandes cantidades de líquidos con las comidas.  · Evite agacharse o recostarse hasta después de 2 o 3 horas de haber comido.    ¿QUÉ ALIMENTOS NO SE RECOMIENDAN?  Estos son algunos alimentos y bebidas que pueden empeorar los síntomas:  Vegetales  Tomates. Jugo de tomate. Salsa de tomate y espagueti. Ajíes. Cebolla y ajo. Rábano picante.  Frutas  Naranjas, pomelos y limón (fruta y jugo).  Carnes  Carnes de vaca, de pescado y de ave con gran contenido de grasas. Esto incluye los perros calientes, las costillas, el jamón, la salchicha, el salame y el tocino.  Lácteos  Leche entera y leche chocolatada. Crema ácida. Crema. Mantequilla. Helados. Queso crema.  Bebidas  Té o café. Bebidas gaseosas o bebidas energizantes.  Condimentos  Salsa picante. Salsa barbacoa.  Dulces/postres   Chocolate y cacao. Rosquillas. Menta y mentol.  Grasas y aceites  Alimentos muy grasos. Esto incluye las papas fritas.  Otros  Vinagre. Especias picantes. Esto incluye la pimienta negra, la pimienta blanca, la pimienta roja, la pimienta de cayena, el curry en polvo, los clavos de olor, el jengibre y el chile en polvo.  Esta no es una lista completa de los alimentos y las bebidas que se deben evitar. Comuníquese con el nutricionista para recibir más información.  Esta información no tiene como fin reemplazar el consejo del médico. Asegúrese de hacerle al médico cualquier pregunta que tenga.  Document Released: 08/06/2011 Document Revised: 02/25/2014 Document Reviewed: 12/09/2012  Elsevier Interactive Patient Education © 2017 Elsevier Inc.

## 2017-03-18 ENCOUNTER — Ambulatory Visit: Payer: Self-pay | Attending: Physician Assistant

## 2017-03-21 ENCOUNTER — Encounter (HOSPITAL_COMMUNITY): Payer: Self-pay | Admitting: Emergency Medicine

## 2017-03-21 ENCOUNTER — Emergency Department (HOSPITAL_COMMUNITY)
Admission: EM | Admit: 2017-03-21 | Discharge: 2017-03-22 | Disposition: A | Discharge status: Home / Self Care | Payer: Self-pay | Attending: Emergency Medicine | Admitting: Emergency Medicine

## 2017-03-21 DIAGNOSIS — Z79899 Other long term (current) drug therapy: Secondary | ICD-10-CM | POA: Insufficient documentation

## 2017-03-21 DIAGNOSIS — R1013 Epigastric pain: Secondary | ICD-10-CM | POA: Insufficient documentation

## 2017-03-21 NOTE — ED Triage Notes (Signed)
Pt reports abdominal pain in upper R quadrant x2 weeks. Reports nausea, no vomiting or diarrhea. Also c/o headaches.

## 2017-03-22 ENCOUNTER — Emergency Department (HOSPITAL_COMMUNITY): Payer: Self-pay

## 2017-03-22 LAB — URINALYSIS, ROUTINE W REFLEX MICROSCOPIC
Bacteria, UA: NONE SEEN
Bilirubin Urine: NEGATIVE
Glucose, UA: NEGATIVE mg/dL
Ketones, ur: NEGATIVE mg/dL
Leukocytes, UA: NEGATIVE
Nitrite: NEGATIVE
Protein, ur: NEGATIVE mg/dL
SPECIFIC GRAVITY, URINE: 1.025 (ref 1.005–1.030)
pH: 5 (ref 5.0–8.0)

## 2017-03-22 LAB — CBC
HCT: 35.9 % — ABNORMAL LOW (ref 36.0–46.0)
Hemoglobin: 12.5 g/dL (ref 12.0–15.0)
MCH: 32.7 pg (ref 26.0–34.0)
MCHC: 34.8 g/dL (ref 30.0–36.0)
MCV: 94 fL (ref 78.0–100.0)
PLATELETS: 224 10*3/uL (ref 150–400)
RBC: 3.82 MIL/uL — ABNORMAL LOW (ref 3.87–5.11)
RDW: 13 % (ref 11.5–15.5)
WBC: 7.1 10*3/uL (ref 4.0–10.5)

## 2017-03-22 LAB — COMPREHENSIVE METABOLIC PANEL
ALK PHOS: 79 U/L (ref 38–126)
ALT: 25 U/L (ref 14–54)
AST: 28 U/L (ref 15–41)
Albumin: 3.9 g/dL (ref 3.5–5.0)
Anion gap: 10 (ref 5–15)
BUN: 10 mg/dL (ref 6–20)
CHLORIDE: 108 mmol/L (ref 101–111)
CO2: 22 mmol/L (ref 22–32)
CREATININE: 0.73 mg/dL (ref 0.44–1.00)
Calcium: 9.2 mg/dL (ref 8.9–10.3)
GFR calc Af Amer: >60 mL/min (ref >60)
Glucose, Bld: 110 mg/dL — ABNORMAL HIGH (ref 65–99)
Potassium: 3.4 mmol/L — ABNORMAL LOW (ref 3.5–5.1)
Sodium: 140 mmol/L (ref 135–145)
Total Bilirubin: 0.4 mg/dL (ref 0.3–1.2)
Total Protein: 6.8 g/dL (ref 6.5–8.1)

## 2017-03-22 LAB — I-STAT BETA HCG BLOOD, ED (MC, WL, AP ONLY): I-stat hCG, quantitative: <5 m[IU]/mL (ref <5)

## 2017-03-22 LAB — LIPASE, BLOOD: Lipase: 26 U/L (ref 11–51)

## 2017-03-22 MED ORDER — GI COCKTAIL ~~LOC~~
30.0000 mL | Freq: Once | ORAL | Status: AC
  Administered 2017-03-22: 30 mL via ORAL
  Filled 2017-03-22: qty 30

## 2017-03-22 MED ORDER — OMEPRAZOLE 20 MG PO CPDR: 20.0000 mg | DELAYED_RELEASE_CAPSULE | Freq: Every day | ORAL | 0 refills | Status: DC

## 2017-03-22 MED ORDER — PANTOPRAZOLE SODIUM 40 MG IV SOLR
40.0000 mg | Freq: Once | INTRAVENOUS | Status: AC
  Administered 2017-03-22: 40 mg via INTRAVENOUS
  Filled 2017-03-22: qty 40

## 2017-03-22 MED ORDER — STERILE WATER FOR INJECTION IJ SOLN
INTRAMUSCULAR | Status: AC
  Filled 2017-03-22: qty 10

## 2017-03-22 MED ORDER — SUCRALFATE 1 G PO TABS: 1.0000 g | ORAL_TABLET | Freq: Three times a day (TID) | ORAL | 0 refills | Status: DC

## 2017-03-22 NOTE — ED Notes (Signed)
PT states understanding of care given, follow up care, and medication prescribed. PT ambulated from ED to car with a steady gait. 

## 2017-03-22 NOTE — ED Notes (Signed)
Called US to see wait, pt is next in line. Pt informed

## 2017-03-22 NOTE — ED Provider Notes (Signed)
[facility] EMERGENCY DEPARTMENT Provider Note   CSN: [id number] Arrival date & time: 03/21/17  2316     History   Chief Complaint Chief Complaint  Patient presents with  . Abdominal Pain  . Nausea    HPI Carmen Pierce is a 42 y.o. female.  Patient presents to the emergency department with a chief complaint of epigastric pain.  She reports having the pain for the past 2 weeks.  She also reports some pain under her right rib.  She reports associated nausea but without vomiting.  She states the pain is worsened after eating.  She denies any fevers or chills.  She has not taken anything for symptoms.  She denies any other associated symptoms.   The history is provided by the patient. No language interpreter was used.    History reviewed. No pertinent past medical history.  Patient Active Problem List   Diagnosis Date Noted  . Pap smear for cervical cancer screening 04/12/2013  . Dental caries extending into dentine 02/25/2013  . Mass of left ear auricle 02/25/2013  . GERD (gastroesophageal reflux disease) 02/25/2013  . Mass of right ear auricle 02/25/2013  . NECK PAIN, RIGHT 06/30/2008  . BACK PAIN, RIGHT 06/30/2008  . 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 HEADACHE 04/30/2007  . HEMATURIA, MICROSCOPIC, HX OF 04/30/2007    Past Surgical History:  Procedure Laterality Date  . CESAREAN SECTION      OB History    No data available       Home Medications    Prior to Admission medications   Medication Sig Start Date End Date Taking? Authorizing Provider  omeprazole (PRILOSEC) 40 MG capsule Take 1 capsule (40 mg total) by mouth daily. 12/03/16   Loletta Specter, PA-C    Family History Family History  Problem Relation Age of Onset  . Kidney disease Daughter     Social History Social History   Tobacco Use  . Smoking status: Never Smoker  . Smokeless tobacco: Never Used  Substance Use Topics  . Alcohol use: No  . Drug use: No      Allergies   Patient has no known allergies.   Review of Systems Review of Systems  All other systems reviewed and are negative.    Physical Exam Updated Vital Signs BP 119/68 (BP Location: Right Arm)   Pulse 76   Temp 98.6 F (37 C) (Oral)   Resp 18   Ht 5\' 5"  (1.651 m)   LMP 03/14/2017 (Exact Date)   SpO2 100%   BMI 32.62 kg/m   Physical Exam  Constitutional: She appears well-developed and well-nourished. No distress.  HENT:  Head: Normocephalic and atraumatic.  Eyes: Conjunctivae are normal.  Neck: Neck supple.  Cardiovascular: Normal rate and regular rhythm.  No murmur heard. Pulmonary/Chest: Effort normal and breath sounds normal. No respiratory distress.  Abdominal: Soft. There is tenderness in the right upper quadrant.  Mild right upper quadrant tenderness, more significant epigastric abdominal tenderness  Musculoskeletal: She exhibits no edema.  Neurological: She is alert.  Skin: Skin is warm and dry.  Psychiatric: She has a normal mood and affect.  Nursing note and vitals reviewed.    ED Treatments / Results  Labs (all labs ordered are listed, but only abnormal results are displayed) Labs Reviewed  COMPREHENSIVE METABOLIC PANEL - Abnormal; Notable for the following components:      Result Value   Potassium 3.4 (*)    Glucose, Bld 110 (*)  All other components within normal limits  CBC - Abnormal; Notable for the following components:   RBC 3.82 (*)    HCT 35.9 (*)    All other components within normal limits  URINALYSIS, ROUTINE W REFLEX MICROSCOPIC - Abnormal; Notable for the following components:   Hgb urine dipstick LARGE (*)    Squamous Epithelial / LPF 0-5 (*)    All other components within normal limits  LIPASE, BLOOD  I-STAT BETA HCG BLOOD, ED ([facility], [facility], [facility] ONLY)    EKG  EKG Interpretation None       Radiology No results found.  Procedures Procedures (including critical care time)  Medications Ordered in  ED Medications  sterile water (preservative free) injection (not administered)  gi cocktail (Maalox,Lidocaine,Donnatal) (30 mLs Oral Given 03/22/17 0133)  pantoprazole (PROTONIX) injection 40 mg (40 mg Intravenous Given 03/22/17 0133)     Initial Impression / Assessment and Plan / ED Course  I have reviewed the triage vital signs and the nursing notes.  Pertinent labs & imaging results that were available during my care of the patient were reviewed by me and considered in my medical decision making (see chart for details).     Patient with mild right upper quadrant abdominal tenderness and moderate epigastric abdominal tenderness.  Will check right upper quadrant ultrasound.  Will treat with GI cocktail and give Protonix.  Will reassess.  US negative.  VSS.    Will treat for gastritis vs peptic ulcer disease.  Patient understands and agrees with the plan.  Final Clinical Impressions(s) / ED Diagnoses   Final diagnoses:  Epigastric pain    ED Discharge Orders        Ordered    sucralfate (CARAFATE) 1 g tablet  3 times daily with meals & bedtime     03/22/17 0355    omeprazole (PRILOSEC) 20 MG capsule  Daily     03/22/17 0355       Roxy HorsemanBrowning, Amarian Botero, PA-C 03/22/17 0357    Dione BoozeGlick, David, MD 03/22/17 [phone]

## 2017-03-24 ENCOUNTER — Ambulatory Visit (INDEPENDENT_AMBULATORY_CARE_PROVIDER_SITE_OTHER): Payer: Self-pay | Admitting: Physician Assistant

## 2017-03-24 ENCOUNTER — Encounter (INDEPENDENT_AMBULATORY_CARE_PROVIDER_SITE_OTHER): Payer: Self-pay | Admitting: Physician Assistant

## 2017-03-24 VITALS — BP 104/70 | HR 76 | Temp 98.0°F | Resp 18 | Ht 65.0 in | Wt 193.0 lb

## 2017-03-24 DIAGNOSIS — R1013 Epigastric pain: Secondary | ICD-10-CM

## 2017-03-24 MED ORDER — RANITIDINE HCL 150 MG PO TABS: 150.0000 mg | ORAL_TABLET | Freq: Two times a day (BID) | ORAL | 0 refills | Status: DC

## 2017-03-24 MED FILL — raNITIdine HCL 150 MG TABS: 150 | 30 days supply | Qty: 60 | Fill #0

## 2017-03-24 NOTE — Patient Instructions (Signed)
Dolor abdominal en los adultos °Abdominal Pain, Adult °El dolor de estómago (abdominal) puede tener muchas causas. La mayoría de las veces, el dolor de estómago no es peligroso. Muchos de estos casos de dolor de estómago pueden controlarse y tratarse en casa. Sin embargo, a veces, el dolor de estómago puede ser grave. El médico intentará descubrir la causa del dolor de estómago. °Siga estas indicaciones en su casa: °· Tome los medicamentos de venta libre y los recetados solamente como se lo haya indicado el médico. No tome medicamentos que lo ayuden a defecar (laxantes), salvo que el médico se lo indique. °· Beba suficiente líquido para mantener el pis (orina) claro o de color amarillo pálido. °· Esté atento al dolor de estómago para detectar cualquier cambio. °· Concurra a todas las visitas de control como se lo haya indicado el médico. Esto es importante. °Comuníquese con un médico si: °· El dolor de estómago cambia o empeora. °· No tiene apetito o baja de peso sin proponérselo. °· Tiene dificultades para defecar (está estreñido) o heces líquidas (diarrea) durante más de 2 o 3 días. °· Siente dolor al orinar o defecar. °· El dolor de estómago lo despierta de noche. °· El dolor empeora con las comidas, después de comer o con determinados alimentos. °· Vomita y no puede retener nada de lo que ingiere. °· Tiene fiebre. °Solicite ayuda de inmediato si: °· El dolor no desaparece en el tiempo indicado por el médico. °· No puede detener los vómitos. °· Siente dolor solamente en zonas específicas del abdomen, como el lado derecho o la parte inferior izquierda. °· Tiene heces con sangre, de color negro o con aspecto alquitranado. °· Tiene dolor muy intenso en el vientre, cólicos o meteorismo. °· Presenta signos de no tener suficientes líquidos o agua en el cuerpo (deshidratación), por ejemplo: °? La orina es oscura, es muy escasa o no orina. °? Labios agrietados. °? Boca seca. °? Ojos  hundidos. °? Somnolencia. °? Debilidad. °Esta información no tiene como fin reemplazar el consejo del médico. Asegúrese de hacerle al médico cualquier pregunta que tenga. °Document Released: 05/03/2008 Document Revised: 05/01/2016 Document Reviewed: 07/19/2015 °Elsevier Interactive Patient Education © 2018 Elsevier Inc. ° °

## 2017-03-24 NOTE — Progress Notes (Signed)
Subjective:  Patient ID: Carmen Pierce, female    DOB: [birth year]  Age: 42 y.o. MRN: [id number]  CC: abdominal pain  HPI  Carmen Pierce is a 42 y.o. female with a medical history of epigastric pain presents to f/u on episgastric pain. Went to the ED three days ago with complaint of epigastric pain. Work up revealed normal WBC, Lipase, and liver enzymes. [place] RUQ was normal. Patient never returned to have her H pylori breath testing after H pylori treatment sent from here 4 months ago. Patient knew about breath testing but did not have time or money for the test. Says epigastric pain is sometimes 10/10. Aggravated with certain foods and when she raises her right arm above her head. Associated with nausea, back pain, headache. Does not endorse BRBPR or melena, fever, CP, palpitations, SOB, rash, GU sxs.    Outpatient Medications Prior to Visit  Medication Sig Dispense Refill  . omeprazole (PRILOSEC) 20 MG capsule Take 1 capsule (20 mg total) by mouth daily. 30 capsule 0  . sucralfate (CARAFATE) 1 g tablet Take 1 tablet (1 g total) by mouth 4 (four) times daily -  with meals and at bedtime. 120 tablet 0   No facility-administered medications prior to visit.      ROS Review of Systems  Constitutional: Negative for chills, fever and malaise/fatigue.  Eyes: Negative for blurred vision.  Respiratory: Negative for shortness of breath.   Cardiovascular: Negative for chest pain and palpitations.  Gastrointestinal: Positive for abdominal pain and nausea.  Genitourinary: Negative for dysuria and hematuria.  Musculoskeletal: Positive for back pain. Negative for joint pain and myalgias.  Skin: Negative for rash.  Neurological: Positive for headaches. Negative for tingling.  Psychiatric/Behavioral: Negative for depression. The patient is not nervous/anxious.     Objective:  BP 104/70 (BP Location: Left Arm, Patient Position: Sitting, Cuff Size: Large)   Pulse 76   Temp 98 F (36.7 C) (Oral)    Resp 18   Ht 5\' 5"  (1.651 m)   Wt 193 lb (87.5 kg)   LMP 03/14/2017 (Exact Date)   SpO2 97%   BMI 32.12 kg/m   BP/Weight 03/24/2017 03/22/2017 12/03/2016  Systolic BP 104 112 115  Diastolic BP 70 72 77  Wt. (Lbs) 193 - 196  BMI 32.12 32.62 32.85      Physical Exam  Constitutional: She is oriented to person, place, and time.  Well developed, well nourished, NAD, polite  HENT:  Head: Normocephalic and atraumatic.  Eyes: No scleral icterus.  Neck: Normal range of motion. Neck supple. No thyromegaly present.  Cardiovascular: Normal rate, regular rhythm and normal heart sounds.  Pulmonary/Chest: Effort normal and breath sounds normal.  Abdominal: Soft. Bowel sounds are normal. There is tenderness (epigastric).  Musculoskeletal: She exhibits no edema.  Neurological: She is alert and oriented to person, place, and time.  Skin: Skin is warm and dry. No rash noted. No erythema. No pallor.  Psychiatric: She has a normal mood and affect. Her behavior is normal. Thought content normal.  Vitals reviewed.    Assessment & Plan:     1. Abdominal pain, epigastric - Begin Ranitidine 150 mg BID. Do not take omeprazole, pantoprazole, pepto bismal for the next two weeks as this may interfere with H pylori breath testing.    Meds ordered this encounter  Medications  . ranitidine (RANITIDINE 150 MAX STRENGTH) 150 MG tablet    Sig: Take 1 tablet (150 mg total) by mouth 2 (two) times daily.  Dispense:  60 tablet    Refill:  0    Order Specific Question:   Supervising Provider    Answer:   Quentin AngstJEGEDE, OLUGBEMIGA E [[id number]]    Follow-up: Return in about 2 weeks (around 04/07/2017) for H pylori breath test.   Loletta Specteroger David Gomez PA

## 2017-04-07 ENCOUNTER — Encounter (INDEPENDENT_AMBULATORY_CARE_PROVIDER_SITE_OTHER): Payer: Self-pay | Admitting: Physician Assistant

## 2017-04-07 ENCOUNTER — Ambulatory Visit (INDEPENDENT_AMBULATORY_CARE_PROVIDER_SITE_OTHER): Payer: Self-pay | Admitting: Physician Assistant

## 2017-04-07 VITALS — BP 125/83 | HR 74 | Temp 98.2°F | Resp 18 | Ht 65.0 in | Wt 191.0 lb

## 2017-04-07 DIAGNOSIS — A048 Other specified bacterial intestinal infections: Secondary | ICD-10-CM

## 2017-04-07 DIAGNOSIS — R1013 Epigastric pain: Secondary | ICD-10-CM

## 2017-04-07 MED ORDER — OMEPRAZOLE 40 MG PO CPDR: 40.0000 mg | DELAYED_RELEASE_CAPSULE | Freq: Every day | ORAL | 3 refills | Status: DC

## 2017-04-07 NOTE — Patient Instructions (Signed)
Gastritis en los adultos °(Gastritis, Adult) °La gastritis es la irritación (inflamación) del estómago. Si tiene esta afección, puede presentar algunos de estos problemas (síntomas): °· Dolor estomacal. °· Sensación de ardor en el estómago. °· Ganas de vomitar (náuseas). °· Vómitos. °· Sensación de estar demasiado lleno después de comer. °Es importante recibir ayuda para esta afección. Sin ayuda, el estómago puede sangrar y se pueden formar llagas (úlceras). °CUIDADOS EN EL HOGAR °· Tome los medicamentos de venta libre y los recetados solamente como se lo haya indicado el médico. °· Si le recetaron un antibiótico, tómelo como se lo haya indicado el médico. No deje de tomarlo aunque comience a sentirse mejor. °· Beba suficiente líquido para mantener el pis (orina) claro o de color amarillo pálido. °· En lugar de comer en gran cantidad, prepare pequeñas porciones de comida. °SOLICITE AYUDA SI: °· Los problemas empeoran. °· Los problemas desaparecen, pero luego vuelven a aparecer. °SOLICITE AYUDA DE INMEDIATO SI: °· Vomita sangre o una sustancia parecida a los granos de café. °· La materia fecal (heces) es negra o de color rojo oscuro. °· No puede retener los líquidos. °· El dolor de estómago empeora. °· Tiene fiebre. °· No mejora luego de 1 semana. °Esta información no tiene como fin reemplazar el consejo del médico. Asegúrese de hacerle al médico cualquier pregunta que tenga. °Document Released: 08/06/2011 Document Revised: 10/29/2014 Document Reviewed: 10/29/2014 °Elsevier Interactive Patient Education © 2018 Elsevier Inc. ° °

## 2017-04-07 NOTE — Progress Notes (Signed)
   Subjective:  Patient ID: Carmen Pierce, female    DOB: [birth year]  Age: 42 y.o. MRN: [id number]  CC: H pylori breath testing  [name] is a 42 y.o. female with a medical history of H pylori infection presents to confirm eradication of H pylori. Says she has been much better since her treatment. Still has some epigastric pain as in previous visits but with much less frequency. Patient abstains from coffee, soda, spicy foods, and chocolate. Has abstained from PPI for the past two weeks and is ready for H pylori breath testing. No other symptoms or complaints to report.         Outpatient Medications Prior to Visit  Medication Sig Dispense Refill  . ranitidine (RANITIDINE 150 MAX STRENGTH) 150 MG tablet Take 1 tablet (150 mg total) by mouth 2 (two) times daily. 60 tablet 0   No facility-administered medications prior to visit.      ROS Review of Systems  Constitutional: Negative for chills, fever and malaise/fatigue.  Eyes: Negative for blurred vision.  Respiratory: Negative for shortness of breath.   Cardiovascular: Negative for chest pain and palpitations.  Gastrointestinal: Positive for abdominal pain. Negative for nausea.  Genitourinary: Negative for dysuria and hematuria.  Musculoskeletal: Negative for joint pain and myalgias.  Skin: Negative for rash.  Neurological: Negative for tingling and headaches.  Psychiatric/Behavioral: Negative for depression. The patient is not nervous/anxious.     Objective:  LMP 03/14/2017 (Exact Date)   BP/Weight 03/24/2017 03/22/2017 12/03/2016  Systolic BP 104 112 115  Diastolic BP 70 72 77  Wt. (Lbs) 193 - 196  BMI 32.12 32.62 32.85      Physical Exam  Constitutional: She is oriented to person, place, and time.  Well developed, overweight, NAD, polite  HENT:  Head: Normocephalic and atraumatic.  Eyes: No scleral icterus.  Neck: Normal range of motion. Neck supple. No thyromegaly present.  Cardiovascular: Normal rate,  regular rhythm and normal heart sounds.  Pulmonary/Chest: Effort normal and breath sounds normal.  Abdominal: Soft. Bowel sounds are normal. There is tenderness (mild TTP of the epigastrium).  Musculoskeletal: She exhibits no edema.  Neurological: She is alert and oriented to person, place, and time.  Skin: Skin is warm and dry. No rash noted. No erythema. No pallor.  Psychiatric: She has a normal mood and affect. Her behavior is normal. Thought content normal.  Vitals reviewed.    Assessment & Plan:   1. H. pylori infection - H. pylori breath test for eradication  2. Abdominal pain, epigastric - omeprazole (PRILOSEC) 40 MG capsule; Take 1 capsule (40 mg total) by mouth daily.  Dispense: 30 capsule; Refill: 3   Meds ordered this encounter  Medications  . omeprazole (PRILOSEC) 40 MG capsule    Sig: Take 1 capsule (40 mg total) by mouth daily.    Dispense:  30 capsule    Refill:  3    Order Specific Question:   Supervising Provider    Answer:   Quentin AngstJEGEDE, OLUGBEMIGA E [id number]    Follow-up: Return if symptoms worsen or fail to improve.   Loletta Specteroger David Marzella Miracle PA

## 2017-04-09 ENCOUNTER — Other Ambulatory Visit (INDEPENDENT_AMBULATORY_CARE_PROVIDER_SITE_OTHER): Payer: Self-pay | Admitting: Physician Assistant

## 2017-04-09 DIAGNOSIS — R1013 Epigastric pain: Secondary | ICD-10-CM

## 2017-04-09 DIAGNOSIS — A048 Other specified bacterial intestinal infections: Secondary | ICD-10-CM | POA: Insufficient documentation

## 2017-04-09 DIAGNOSIS — Z79899 Other long term (current) drug therapy: Secondary | ICD-10-CM | POA: Insufficient documentation

## 2017-04-09 LAB — H. PYLORI BREATH TEST: H pylori Breath Test: POSITIVE — AB

## 2017-04-09 MED ORDER — BISMUTH SUBSALICYLATE 525 MG/15ML PO SUSP: 15.0000 mL | Freq: Four times a day (QID) | ORAL | 0 refills | Status: AC

## 2017-04-09 MED ORDER — TETRACYCLINE HCL 500 MG PO CAPS: 500.0000 mg | ORAL_CAPSULE | Freq: Four times a day (QID) | ORAL | 0 refills | Status: DC

## 2017-04-09 MED ORDER — FLUCONAZOLE 150 MG PO TABS: 150.0000 mg | ORAL_TABLET | ORAL | 0 refills | Status: DC

## 2017-04-09 MED ORDER — OMEPRAZOLE 40 MG PO CPDR: 40.0000 mg | DELAYED_RELEASE_CAPSULE | Freq: Two times a day (BID) | ORAL | 0 refills | Status: DC

## 2017-04-09 MED ORDER — METRONIDAZOLE 500 MG PO TABS: 500.0000 mg | ORAL_TABLET | Freq: Three times a day (TID) | ORAL | 0 refills | Status: AC

## 2017-04-09 MED FILL — OMEPRAZOLE DR 40 MG CAPSULE: 40 | 10 days supply | Qty: 20 | Fill #0

## 2017-04-09 MED FILL — ?METRONIDAZOLE 500MG TABS: 500 | 10 days supply | Qty: 30 | Fill #0

## 2017-04-09 MED FILL — TETRACYCLINE 500 MG CAPSULE: 500 | 10 days supply | Qty: 40 | Fill #0

## 2017-04-09 MED FILL — FLUCONAZOLE 150 MG TABLET: 150 | 14 days supply | Qty: 2 | Fill #0

## 2017-04-21 ENCOUNTER — Telehealth: Payer: Self-pay | Admitting: *Deleted

## 2017-04-21 NOTE — Telephone Encounter (Signed)
Medical Assistant used [employer] to contact patient.  Interpreter Name: Paulino Rily #: [id number] Patient was not available, [employer] left patient a voicemail. Patient is aware of stomach bacteria being present and another set of medications being sent to [facility] for pick up and to take them as directed.

## 2017-04-21 NOTE — Telephone Encounter (Signed)
-----   Message from Loletta Specteroger David Gomez, PA-C sent at 04/09/2017  9:32 AM EST ----- Patient still has the stomach bacteria. I will need to send another set of antibiotics to [facility] pharmacy. Please take these medications exactly as directed.

## 2017-06-03 ENCOUNTER — Ambulatory Visit (INDEPENDENT_AMBULATORY_CARE_PROVIDER_SITE_OTHER): Payer: Self-pay | Admitting: Nurse Practitioner

## 2017-06-03 ENCOUNTER — Other Ambulatory Visit: Payer: Self-pay

## 2017-06-03 ENCOUNTER — Encounter (INDEPENDENT_AMBULATORY_CARE_PROVIDER_SITE_OTHER): Payer: Self-pay | Admitting: Nurse Practitioner

## 2017-06-03 VITALS — BP 129/83 | HR 68 | Temp 98.0°F | Ht 65.0 in | Wt 190.8 lb

## 2017-06-03 DIAGNOSIS — R1013 Epigastric pain: Secondary | ICD-10-CM

## 2017-06-03 DIAGNOSIS — A048 Other specified bacterial intestinal infections: Secondary | ICD-10-CM

## 2017-06-03 MED ORDER — GI COCKTAIL ~~LOC~~
30.0000 mL | Freq: Two times a day (BID) | ORAL | 0 refills | Status: DC
Start: 2017-06-03 — End: 2017-06-03

## 2017-06-03 MED ORDER — GI COCKTAIL ~~LOC~~: 30.0000 mL | Freq: Two times a day (BID) | ORAL | 0 refills | Status: AC

## 2017-06-03 NOTE — Progress Notes (Signed)
Assessment & Plan:  Carmen Pierce was seen today for abdominal pain.  Diagnoses and all orders for this visit:  Epigastric pain -     Ambulatory referral to Gastroenterology -     Alum & Mag Hydroxide-Simeth (GI COCKTAIL) SUSP suspension; Take 30 mLs by mouth 2 (two) times daily for 7 days. Shake well. INSTRUCTIONS: Avoid GERD Triggers: acidic, spicy or fried foods, caffeine, coffee, sodas,  alcohol and chocolate.   Helicobacter pylori (H. pylori) infection -     Ambulatory referral to Gastroenterology   Patient has been counseled on age-appropriate routine health concerns for screening and prevention. These are reviewed and up-to-date. Referrals have been placed accordingly. Immunizations are up-to-date or declined.    Subjective:   Chief Complaint  Patient presents with  . Abdominal Pain   HPI   Carmen Pierce 42 y.o. female presents to office today with complaints of lower epigastric/generalized abdominal pain.   ABDOMINAL PAIN  sSe has a history of Hpylori and endorses medication compliance with completion of treatment.  It is unclear if she has completed her full regimen of tetracycline as it still appears active on her prescription list and appears not to have expired at this time.  She was diagnosed with H. pylori via breath test on 04/07/2017 and she had a positive IgG on 11/05/2016.  Pain is described as a burning sensation and feeling as if her throat is being squeezed.  Pain is increased after eating.  She has avoided spicy, acidic, or fried foods.  She is currently taking Zantac and omeprazole as prescribed.  Endorses nausea with no vomiting. At this point she will need to be referred to GI as I do not feel comfortable re-prescribing the same treatment regimen that she has endorsed completion of.  She may likely need an endoscopy as her symptoms are poorly controlled on ranitidine 150 mg twice daily omeprazole 40 mg twice daily.    Review of Systems  Constitutional: Negative  for fever, malaise/fatigue and weight loss.  Respiratory: Negative.  Negative for cough and shortness of breath.   Cardiovascular: Negative.  Negative for chest pain, palpitations and leg swelling.  Gastrointestinal: Positive for abdominal pain, heartburn and nausea. Negative for vomiting.  Musculoskeletal: Negative.  Negative for myalgias.  Neurological: Negative.  Negative for dizziness, focal weakness, seizures and headaches.  Psychiatric/Behavioral: Negative.  Negative for suicidal ideas.    History reviewed. No pertinent past medical history.  Past Surgical History:  Procedure Laterality Date  . CESAREAN SECTION      Family History  Problem Relation Age of Onset  . Kidney disease Daughter     Social History Reviewed with no changes to be made today.   Outpatient Medications Prior to Visit  Medication Sig Dispense Refill  . fluconazole (DIFLUCAN) 150 MG tablet Take 1 tablet (150 mg total) by mouth once a week. 2 tablet 0  . omeprazole (PRILOSEC) 40 MG capsule Take 1 capsule (40 mg total) by mouth 2 (two) times daily. 20 capsule 0  . ranitidine (RANITIDINE 150 MAX STRENGTH) 150 MG tablet Take 1 tablet (150 mg total) by mouth 2 (two) times daily. 60 tablet 0  . tetracycline (ACHROMYCIN,SUMYCIN) 500 MG capsule Take 1 capsule (500 mg total) by mouth 4 (four) times daily. 40 capsule 0   No facility-administered medications prior to visit.     No Known Allergies     Objective:    BP 129/83 (BP Location: Left Arm, Patient Position: Sitting, Cuff Size: Large)  Pulse 68   Temp 98 F (36.7 C) (Oral)   Ht 5\' 5"  (1.651 m)   Wt 190 lb 12.8 oz (86.5 kg)   LMP 05/15/2017 (Exact Date)   SpO2 100%   BMI 31.75 kg/m  Wt Readings from Last 3 Encounters:  06/03/17 190 lb 12.8 oz (86.5 kg)  04/07/17 191 lb (86.6 kg)  03/24/17 193 lb (87.5 kg)    Physical Exam  Constitutional: She is oriented to person, place, and time. She appears well-developed and well-nourished. She is  cooperative.  HENT:  Head: Normocephalic and atraumatic.  Eyes: EOM are normal.  Neck: Normal range of motion.  Cardiovascular: Normal rate, regular rhythm, normal heart sounds and intact distal pulses. Exam reveals no gallop and no friction rub.  No murmur heard. Pulmonary/Chest: Effort normal and breath sounds normal. No tachypnea. No respiratory distress. She has no decreased breath sounds. She has no wheezes. She has no rhonchi. She has no rales. She exhibits no tenderness.  Abdominal: Soft. Bowel sounds are normal. She exhibits no pulsatile liver, no ascites, no pulsatile midline mass and no mass. There is tenderness in the epigastric area and periumbilical area.  Musculoskeletal: Normal range of motion. She exhibits no edema.  Neurological: She is alert and oriented to person, place, and time. Coordination normal.  Skin: Skin is warm and dry.  Psychiatric: She has a normal mood and affect. Her behavior is normal. Judgment and thought content normal.  Nursing note and vitals reviewed.      Patient has been counseled extensively about nutrition and exercise as well as the importance of adherence with medications and regular follow-up. The patient was given clear instructions to go to ER or return to medical center if symptoms don't improve, worsen or new problems develop. The patient verbalized understanding.   Follow-up: Return if symptoms worsen or fail to improve.   Claiborne Rigg, FNP-BC [facility] [place], [place] [phone]   06/03/2017, 12:54 PM

## 2017-06-03 NOTE — Patient Instructions (Signed)
Infeccin por Helicobacter Pylori (Helicobacter Pylori Infection) La infeccin por Helicobacter pylori es una infeccin en el estmago que es causada por la bacteria Helicobacter pylori (H. pylori). Este tipo de bacteria vive frecuentemente en el revestimiento del estmago. La infeccin puede causar lceras e irritacin (gastritis) en algunas personas. Es la causa ms comn de lceras en el estmago (lcera gstrica) y en la parte superior del intestino (lcera duodenal). Tener esta infeccin tambin puede aumentar el riesgo de cncer de estmago y un tipo de cncer de los glbulos blancos (linfoma) que afecta al estmago. CAUSAS H. pylori es un tipo de bacteria que se encuentra frecuentemente en el estmago de las personas saludables. La bacteria puede pasar de una persona a otra por contacto a travs de las heces o la saliva. No se sabe por qu algunas personas desarrollan lceras, gastritis o cncer a partir de la infeccin. FACTORES DE RIESGO Es ms probable que esta afeccin se manifieste en las personas que:  Tienen familiares con esta infeccin.  Viven con muchas otras personas; por ejemplo, en un dormitorio.  Son de origen africano, hispano o asitico. SNTOMAS La mayora de las personas con esta infeccin no tienen sntomas. Si tiene sntomas, estos pueden incluir los siguientes:  Acidez estomacal.  Dolor de estmago.  Nuseas.  Vmitos.  Sangre en el vmito.  Prdida del apetito.  Mal aliento. DIAGNSTICO Esta afeccin se puede diagnosticar en funcin de los sntomas, un examen fsico y diferentes pruebas. Podrn solicitarle otros estudios, por ejemplo:  Anlisis de sangre o pruebas de materia fecal para verificar las protenas (anticuerpos) que el cuerpo puede producir en respuesta a las bacterias. Estas pruebas son la mejor manera de confirmar el diagnstico.  Una prueba de aliento para verificar el tipo de gas que la bacteria H. pylori libera despus de descomponer una  sustancia llamada urea. Para la prueba, se le pide que beba urea. Esta prueba se realiza frecuentemente despus del tratamiento para saber si el tratamiento funcion.  Un procedimiento en el que un tubo delgado y flexible con una pequea cmara en el extremo se coloca en el estmago y el intestino superior (endoscopia superior). El mdico tambin puede tomar muestras de tejido (biopsia) para realizar pruebas de H. pylori y cncer. TRATAMIENTO El tratamiento para esta afeccin generalmente implica tomar una combinacin de medicamentos (terapia triple) durante un par de semanas. La terapia triple incluye un medicamento para reducir el cido en el estmago y dos tipos de antibiticos. Se aprobaron muchas combinaciones de frmacos para el tratamiento. El tratamiento generalmente mata a la H. pylori y reduce el riesgo de cncer. Despus del tratamiento, es posible que deba volver a realizarse una prueba de H. pylori. En algunos casos, es posible que sea necesario repetir el tratamiento. INSTRUCCIONES PARA EL CUIDADO EN EL HOGAR  Tome los medicamentos de venta libre y los recetados solamente como se lo haya indicado el mdico.  Tome los antibiticos como se lo haya indicado el mdico. No deje de tomar los antibiticos aunque comience a sentirse mejor.  Puede retomar todas sus actividades habituales y continuar su dieta habitual.  Tome estas medidas para evitar infecciones futuras: ? Lvese las manos con frecuencia. ? Asegrese de que los alimentos que consume se hayan preparado adecuadamente. ? Beba agua solamente de fuentes limpias.  Concurra a todas las visitas de control como se lo haya indicado el mdico. Esto es importante. SOLICITE ATENCIN MDICA SI:  Los sntomas no mejoran.  Los sntomas regresan despus del tratamiento. Esta   informacin no tiene Theme park manager el consejo del mdico. Asegrese de hacerle al mdico cualquier pregunta que tenga. Document Released: 05/29/2015 Document  Revised: 05/29/2015 Document Reviewed: 02/16/2014 Elsevier Interactive Patient Education  2018 [employer].  Dieta suave ([place] Diet) La dieta suave se compone de alimentos que no contienen mucha grasa o [place]. Para el cuerpo, es ms fcil digerir los alimentos con bajo contenido de [place] o de [place]. Adems, es menos probable que estos causen San[employer]I boca, la garganta, el estmago y otras partes del tubo digestivo. A menudo, se conoce a la dieta suave como dieta BRAT (por sus siglas en ingls). EN QU CONSISTE EL PLAN? El mdico o el nutricionista pueden recomendar cambios especficos en la dieta para evitar y tratar los sntomas, por ejemplo:  Consumir pequeas cantidades de comida con frecuencia.  Cocinar los alimentos hasta que estn lo bastante blandos para masticarlos con facilidad.  Masticar bien la comida.  Beber lquidos lentamente.  No consumir alimentos muy picantes, cidos o grasosos.  No comer frutas ctricas, como naranjas y pomelos. QU DEBO SABER ACERCA DE ESTA DIETA?  Consuma diferentes alimentos de lista de alimentos de la dieta [place].  No siga una dieta suave durante ms tiempo del [place].  Pregntele al mdico si debe tomar vitaminas. QU ALIMENTOS PUEDO COMER? Cereales Cereales calientes, como crema de trigo. Panes, galletas o tortillas elaborados con harina blanca refinada. Arroz. Verduras Verduras cocidas o enlatadas. Pur de papas o papas hervidas. Nils Pyle Bananas. Pur de Praxair. Otros tipos de frutas cocidas o enlatadas peladas y sin semillas, por ejemplo, duraznos o peras en lata. Carnes y otras fuentes de protenas Huevos revueltos. Mantequilla de man cremosa u otras mantequillas de frutos secos. Carnes [employer]ning Incorporated cocidas, como ave o pescado. Tofu. Sopas o caldos. Lcteos Productos lcteos sin grasa, como Romeo, queso cottage y Dentist. CHS Inc. T de hierbas. Jugo de [place]. Dulces y postres Pudin. Natillas. Gelatina de frutas.  Helados. Grasas y aceites Aderezos suaves para ensaladas. Aceite de canola o de oliva. Esta no es Raytheon de los alimentos o las bebidas permitidos. Comunquese con el nutricionista para conocer ms opciones. QU ALIMENTOS NO SE RECOMIENDAN? Los alimentos y los ingredientes que frecuentemente no se recomiendan incluyen los siguientes:  Alimentos picantes, como salsas picantes.  Comidas fritas.  Alimentos cidos, como encurtidos o alimentos fermentados.  Verduras o frutas crudas, especialmente ctricos o frutos del bosque.  Bebidas que contengan cafena.  Alcohol.  Aderezos o condimentos muy saborizados. Es posible que los productos que se enumeran ms arriba no sean una lista completa de los alimentos y las bebidas que no estn permitidos. Comunquese con el nutricionista para obtener ms informacin. Esta informacin no tiene Theme park manager el consejo del mdico. Asegrese de hacerle al mdico cualquier pregunta que tenga. Document Released: 05/29/2015 Document Revised: 05/29/2015 Document Reviewed: 02/16/2014 Elsevier Interactive Patient Education  2018 [employer].  Opciones de alimentos para pacientes con reflujo gastroesofgico - Adultos (Food Choices for Gastroesophageal Reflux Disease, Adult) Cuando se tiene reflujo gastroesofgico (ERGE), los alimentos que se ingieren y los hbitos de alimentacin son Engineer, production. Elegir los alimentos adecuados puede ayudar a Altria Group. QU PAUTAS DEBO SEGUIR?  Elija las frutas, los vegetales, los cereales integrales y los productos lcteos con bajo contenido de [place].  Elija las carnes de [place], de pescado y de ave con bajo contenido de grasas.  Limite las grasas, [address] [place], los aderezos para [place], la [place], los frutos secos  y Programme researcher, broadcasting/film/videoel aguacate.  Lleve un registro de alimentos. Esto ayuda a identificar los alimentos que ocasionan sntomas.  Evite los alimentos que le ocasionen sntomas. Pueden  ser distintos para cada persona.  Haga comidas pequeas durante Glass blower/designerel da en lugar de 3 comidas abundantes.  Coma lentamente, en un lugar donde est distendido.  Limite el consumo de alimentos fritos.  Cocine los alimentos utilizando mtodos que no sean la fritura.  Evite el consumo alcohol.  Evite beber grandes cantidades de lquidos con las comidas.  Evite agacharse o recostarse hasta despus de 2 o 3horas de haber comido.  QU ALIMENTOS NO SE RECOMIENDAN? Estos son algunos alimentos y bebidas que pueden empeorar los sntomas: Veterinary surgeonVegetales Tomates. Jugo de tomate. Salsa de tomate y espagueti. Ajes. [place] y [place] Sh[place]ajo. Rbano picante. Frutas Naranjas, pomelos y limn (fruta y [place]jugo). Carnes Carnes de [place], de pescado y de ave con gran contenido de grasas. Esto incluye los perros calientes, las [place]er[place]costillas, el Benedictjamn, la salchicha, el salame y el tocino. Lcteos Leche entera y [place]le[place] chocolatada. PPG IndustriesCrema cida. Crema. Mantequilla. Helados. Queso crema. Bebidas T o caf. Bebidas gaseosas o bebidas energizantes. Condimentos Salsa picante. Salsa barbacoa. Dulces/postres Chocolate y cacao. Rosquillas. Menta y mentol. Grasas y Du Pontaceites Alimentos muy grasos. Esto incluye las papas fritas. Otros Vinagre. Especias picantes. Esto incluye la pimienta negra, la pimienta blanca, la pimienta roja, la pimienta de cayena, el curry en polvo, los clavos de [place]olor, el jengibre y el [place] en polvo. Esta no es Raytheonuna lista completa de los alimentos y las bebidas que se Theatre stage managerdeben evitar. Comunquese con el nutricionista para recibir ms informacin. Esta informacin no tiene Theme park managercomo fin reemplazar el consejo del mdico. Asegrese de hacerle al mdico cualquier pregunta que tenga. Document Released: 08/06/2011 Document Revised: 02/25/2014 Document Reviewed: 12/09/2012 Elsevier Interactive Patient Education  2017 ArvinMeritorElsevier Inc.

## 2017-06-17 ENCOUNTER — Encounter: Payer: Self-pay | Admitting: Gastroenterology

## 2017-07-29 ENCOUNTER — Ambulatory Visit: Payer: Self-pay | Admitting: Gastroenterology

## 2017-09-10 ENCOUNTER — Ambulatory Visit: Payer: Self-pay

## 2017-09-17 ENCOUNTER — Ambulatory Visit: Payer: Self-pay | Attending: Physician Assistant

## 2017-09-23 ENCOUNTER — Encounter: Payer: Self-pay | Admitting: Gastroenterology

## 2017-09-23 ENCOUNTER — Ambulatory Visit: Payer: Self-pay | Admitting: Gastroenterology

## 2017-09-23 VITALS — BP 110/70 | HR 68 | Ht 65.0 in | Wt 191.6 lb

## 2017-09-23 DIAGNOSIS — K219 Gastro-esophageal reflux disease without esophagitis: Secondary | ICD-10-CM

## 2017-09-23 DIAGNOSIS — R1013 Epigastric pain: Secondary | ICD-10-CM

## 2017-09-23 DIAGNOSIS — G8929 Other chronic pain: Secondary | ICD-10-CM

## 2017-09-23 NOTE — Progress Notes (Signed)
HPI: This is a very pleasant 42 year old woman who was referred to me by Claiborne Rigg, NP  to evaluate epigastric pain.    Chief complaint is epigastric pain she has had epigastric pain for about 6 months.  It is a burning pain.  It can last several weeks.  She has associated nausea.  Eating tends to bring the pain on.  She was found to have H. pylori breath test positive and she has undergone 2 antibiotic courses for it.  She does not recall the names of the antibiotics but it looks like it was a Pylera based treatment from epic review.  She does not take any NSAIDs.  Blood tests and ultrasound in early 2019 were normal  Weight stable.   Old Data Reviewed:  H. pylori serology was +2018.  H. pylori breath test was +February 2019. labs 03/2017: cbc and cmet essentially normal [place] abd 03/2017 was normal.     Review of systems: Pertinent positive and negative review of systems were noted in the above HPI section. All other review negative.   History reviewed. No pertinent past medical history.  Past Surgical History:  Procedure Laterality Date  . CESAREAN SECTION      No current outpatient medications on file.   No current facility-administered medications for this visit.     Allergies as of 09/23/2017  . (No Known Allergies)    Family History  Problem Relation Age of Onset  . Kidney disease Daughter     Social History   Socioeconomic History  . Marital status: Single    Spouse name: Not on file  . Number of children: Not on file  . Years of education: Not on file  . Highest education level: Not on file  Occupational History  . Not on file  Social Needs  . Financial resource strain: Not on file  . Food insecurity:    Worry: Not on file    Inability: Not on file  . Transportation needs:    Medical: Not on file    Non-medical: Not on file  Tobacco Use  . Smoking status: Never Smoker  . Smokeless tobacco: Never Used  Substance and Sexual Activity  .  Alcohol use: No  . Drug use: No  . Sexual activity: Yes  Lifestyle  . Physical activity:    Days per week: Not on file    Minutes per session: Not on file  . Stress: Not on file  Relationships  . Social connections:    Talks on phone: Not on file    Gets together: Not on file    Attends religious service: Not on file    Active member of club or organization: Not on file    Attends meetings of clubs or organizations: Not on file    Relationship status: Not on file  . Intimate partner violence:    Fear of current or ex partner: Not on file    Emotionally abused: Not on file    Physically abused: Not on file    Forced sexual activity: Not on file  Other Topics Concern  . Not on file  Social History Narrative  . Not on file     Physical Exam: BP 110/70   Pulse 68   Ht 5\' 5"  (1.651 m)   Wt 191 lb 9.6 oz (86.9 kg)   LMP 08/22/2017 (Approximate)   BMI 31.88 kg/m  Constitutional: generally well-appearing Psychiatric: alert and oriented x3 Eyes: extraocular movements intact Mouth: oral  pharynx moist, no lesions Neck: supple no lymphadenopathy Cardiovascular: heart regular rate and rhythm Lungs: clear to auscultation bilaterally Abdomen: soft, nontender, nondistended, no obvious ascites, no peritoneal signs, normal bowel sounds Extremities: no lower extremity edema bilaterally Skin: no lesions on visible extremities   Assessment and plan: 42 y.o. female with intermittent epigastric pain, burning.  Possibly this is just GERD related pain and so I recommended that she start daily over-the-counter proton pump inhibitor once daily.  Possibly H. pylori is still present.  I recommended EGD for further evaluation with plans to biopsy the stomach even if it appears normal given her history of H. pylori breath test positive.  Will also look for ulcers, gastritis, significant esophagitis that may present similarly.    Please see the "Patient Instructions" section for addition  details about the plan.   Rob Buntinganiel Lilou Kneip, MD [facility] 09/23/2017, 3:03 PM  Cc: Claiborne RiggFleming, Zelda W, NP

## 2017-09-23 NOTE — Patient Instructions (Addendum)
You will be set up for an upper endoscopy for epigastric pain. OTC omeprazole 20mg  pill, once daily until the EGD.  Normal BMI (Body Mass Index- based on height and weight) is between 19 and 25. Your BMI today is Body mass index is 31.88 kg/m. Marland Kitchen. Please consider follow up  regarding your BMI with your Primary Care Provider.

## 2017-09-24 ENCOUNTER — Ambulatory Visit (AMBULATORY_SURGERY_CENTER): Payer: Self-pay | Admitting: Gastroenterology

## 2017-09-24 ENCOUNTER — Telehealth: Payer: Self-pay | Admitting: *Deleted

## 2017-09-24 ENCOUNTER — Encounter: Payer: Self-pay | Admitting: Gastroenterology

## 2017-09-24 VITALS — BP 136/85 | HR 69 | Temp 98.0°F | Resp 18 | Ht 65.0 in | Wt 191.0 lb

## 2017-09-24 DIAGNOSIS — K219 Gastro-esophageal reflux disease without esophagitis: Secondary | ICD-10-CM

## 2017-09-24 DIAGNOSIS — K2970 Gastritis, unspecified, without bleeding: Secondary | ICD-10-CM

## 2017-09-24 DIAGNOSIS — K2950 Unspecified chronic gastritis without bleeding: Secondary | ICD-10-CM

## 2017-09-24 DIAGNOSIS — B9681 Helicobacter pylori [H. pylori] as the cause of diseases classified elsewhere: Secondary | ICD-10-CM

## 2017-09-24 MED ORDER — OMEPRAZOLE 20 MG PO CPDR: 20.0000 mg | DELAYED_RELEASE_CAPSULE | Freq: Every day | ORAL | 0 refills | Status: DC

## 2017-09-24 MED ORDER — SODIUM CHLORIDE 0.9 % IV SOLN: 500.0000 mL | Freq: Once | INTRAVENOUS | Status: DC

## 2017-09-24 NOTE — Progress Notes (Signed)
Pt's states no medical or surgical changes since previsit or office visit. 

## 2017-09-24 NOTE — Patient Instructions (Signed)
   Omeprazole (Prilosec)  20 mg  Over the counter by mouth daily      Endoscopia superior, cuidados posteriore  Upper Endoscopy, Care After Siga estas instrucciones durante las prximas semanas. Estas indicaciones le proporcionan informacin acerca de cmo deber cuidarse despus del procedimiento. Su mdico tambin podr darle instrucciones ms especficas. El tratamiento ha sido planificado segn las prcticas mdicas actuales, pero en algunos casos pueden ocurrir problemas. Comunquese con el mdico si tiene algn problema o dudas despus del procedimiento. Qu puedo esperar despus del procedimiento? Despus del procedimiento, es comn DIRECTVtener los siguientes sntomas:  Dolor de Advertising copywritergarganta.  Meteorismo.  Nuseas.  Siga estas instrucciones en su casa:  Siga las indicaciones del mdico respecto de qu comer o beber despus del procedimiento.  Retome sus actividades normales como se lo haya indicado el mdico. Pregntele al mdico qu actividades son seguras para usted.  Tome los medicamentos de venta libre y los recetados solamente como se lo haya indicado el mdico.  No conduzca durante 24horas si le administraron un sedante.  Concurra a todas las visitas de control como se lo haya indicado el mdico. Esto es importante. Comunquese con un mdico si:  Tiene dolor de garganta que dura ms de Civil engineer, contractingun da.  Presenta dificultad para tragar. Solicite ayuda de inmediato si:  Tiene fiebre.  Vomita sangre o el vmito tiene un aspecto similar al poso del caf.  La materia fecal es negra, de aspecto alquitranado.  Tiene un dolor de garganta muy intenso o no puede tragar.  Tiene dificultad para respirar.  Siente un dolor intenso en el pecho o el abdomen. Esta informacin no tiene Theme park managercomo fin reemplazar el consejo del mdico. Asegrese de hacerle al mdico cualquier pregunta que tenga. Document Released: 08/06/2011 Document Revised: 05/22/2016 Document Reviewed: 11/17/2014 Elsevier  Interactive Patient Education  [employer]2018 Elsevier Inc.

## 2017-09-24 NOTE — Progress Notes (Signed)
Called to room to assist during endoscopic procedure.  Patient ID and intended procedure confirmed with present staff. Received instructions for my participation in the procedure from the performing physician.  

## 2017-09-24 NOTE — Op Note (Signed)
[facility] Patient Name: Carmen Pierce Procedure Date: 09/24/2017 1:20 PM MRN: [id number] Endoscopist: Rachael Fee , MD Age: 42 Referring MD:  Date of Birth: 10-Jan-1976 Gender: Female Account #: [id number] Procedure:                Upper GI endoscopy Indications:              Epigastric abdominal pain; recent H. pylori breath                            test +, treated but still with symptoms Medicines:                Monitored Anesthesia Care Procedure:                Pre-Anesthesia Assessment:                           - Prior to the procedure, a History and Physical                            was performed, and patient medications and                            allergies were reviewed. The patient's tolerance of                            previous anesthesia was also reviewed. The risks                            and benefits of the procedure and the sedation                            options and risks were discussed with the patient.                            All questions were answered, and informed consent                            was obtained. Prior Anticoagulants: The patient has                            taken no previous anticoagulant or antiplatelet                            agents. ASA Grade Assessment: II - A patient with                            mild systemic disease. After reviewing the risks                            and benefits, the patient was deemed in                            satisfactory condition to undergo the procedure.  After obtaining informed consent, the endoscope was                            passed under direct vision. Throughout the                            procedure, the patient's blood pressure, pulse, and                            oxygen saturations were monitored continuously. The                            Endoscope was introduced through the mouth, and                            advanced to  the second part of duodenum. The upper                            GI endoscopy was accomplished without difficulty.                            The patient tolerated the procedure well. Scope In: Scope Out: Findings:                 Mild inflammation characterized by erythema,                            friability and granularity was found in the gastric                            antrum. Biopsies were taken with a cold forceps for                            histology.                           The exam was otherwise without abnormality. Complications:            No immediate complications. Estimated blood loss:                            None. Estimated Blood Loss:     Estimated blood loss: none. Impression:               - Mild gastritis. Biopsied to check for H. pylori.                           - The examination was otherwise normal. Recommendation:           - Patient has a contact number available for                            emergencies. The signs and symptoms of potential                            delayed complications were discussed with the  patient. Return to normal activities tomorrow.                            Written discharge instructions were provided to the                            patient.                           - Resume previous diet.                           - Continue present medications: once daily OTC                            omeprazole (20mg  pill)                           - Await pathology results. Rachael Feeaniel P Jacobs, MD 09/24/2017 1:32:09 PM This report has been signed electronically.

## 2017-09-25 ENCOUNTER — Telehealth: Payer: Self-pay

## 2017-09-25 NOTE — Telephone Encounter (Signed)
  Follow up Call-  Call back number 09/24/2017 09/24/2017  Post procedure Call Back phone  # [phone] Call Victorio PalmGuadalupe, pt non-english speaking [phone]  Permission to leave phone message Yes Yes  Some recent data might be hidden     Left message

## 2017-09-25 NOTE — Telephone Encounter (Signed)
Left message

## 2017-10-01 NOTE — Telephone Encounter (Signed)
opened this encounter in error.

## 2017-10-07 ENCOUNTER — Telehealth: Payer: Self-pay | Admitting: Gastroenterology

## 2017-10-07 ENCOUNTER — Other Ambulatory Visit: Payer: Self-pay

## 2017-10-07 MED ORDER — BIS SUBCIT-METRONID-TETRACYC 140-125-125 MG PO CAPS: 3.0000 | ORAL_CAPSULE | Freq: Three times a day (TID) | ORAL | 0 refills | Status: DC

## 2017-10-07 NOTE — Telephone Encounter (Signed)
Prescription sent to [facility] [address] per pt request

## 2017-10-09 ENCOUNTER — Telehealth: Payer: Self-pay | Admitting: Gastroenterology

## 2017-10-09 MED ORDER — BIS SUBCIT-METRONID-TETRACYC 140-125-125 MG PO CAPS: 3.0000 | ORAL_CAPSULE | Freq: Three times a day (TID) | ORAL | 0 refills | Status: DC

## 2017-10-09 NOTE — Telephone Encounter (Signed)
Prescription has been sent to pharmacy requested. 

## 2017-10-09 NOTE — Telephone Encounter (Signed)
The pharmacy states that they can not fill the pylera for 14 days.  She advised me that they will call the pt advise her she will need to go to another pharmacy.

## 2017-10-13 ENCOUNTER — Telehealth: Payer: Self-pay | Admitting: Gastroenterology

## 2017-10-13 MED ORDER — TETRACYCLINE HCL 500 MG PO CAPS: 500.0000 mg | ORAL_CAPSULE | Freq: Four times a day (QID) | ORAL | 0 refills | Status: AC

## 2017-10-13 MED ORDER — OMEPRAZOLE 20 MG PO CPDR: 20.0000 mg | DELAYED_RELEASE_CAPSULE | Freq: Four times a day (QID) | ORAL | 0 refills | Status: DC

## 2017-10-13 MED ORDER — BISMUTH SUBSALICYLATE 262 MG PO TABS: 524.0000 mg | ORAL_TABLET | Freq: Four times a day (QID) | ORAL | 0 refills | Status: AC

## 2017-10-13 MED ORDER — METRONIDAZOLE 500 MG PO TABS: 500.0000 mg | ORAL_TABLET | Freq: Four times a day (QID) | ORAL | 0 refills | Status: AC

## 2017-10-13 NOTE — Telephone Encounter (Signed)
Alternative sent to the pharmacy.

## 2017-11-21 ENCOUNTER — Encounter: Payer: Self-pay | Admitting: Gastroenterology

## 2017-12-08 ENCOUNTER — Ambulatory Visit: Payer: Self-pay | Admitting: Gastroenterology

## 2019-05-23 ENCOUNTER — Other Ambulatory Visit: Payer: Self-pay

## 2019-05-23 ENCOUNTER — Ambulatory Visit (HOSPITAL_COMMUNITY)
Admission: EM | Admit: 2019-05-23 | Discharge: 2019-05-23 | Disposition: A | Discharge status: Home / Self Care | Payer: Self-pay | Attending: Family Medicine | Admitting: Family Medicine

## 2019-05-23 ENCOUNTER — Encounter (HOSPITAL_COMMUNITY): Payer: Self-pay

## 2019-05-23 DIAGNOSIS — M542 Cervicalgia: Secondary | ICD-10-CM

## 2019-05-23 DIAGNOSIS — S46811A Strain of other muscles, fascia and tendons at shoulder and upper arm level, right arm, initial encounter: Secondary | ICD-10-CM

## 2019-05-23 DIAGNOSIS — W19XXXA Unspecified fall, initial encounter: Secondary | ICD-10-CM

## 2019-05-23 DIAGNOSIS — M546 Pain in thoracic spine: Secondary | ICD-10-CM

## 2019-05-23 MED ORDER — CYCLOBENZAPRINE HCL 10 MG PO TABS: 10.0000 mg | ORAL_TABLET | Freq: Two times a day (BID) | ORAL | 0 refills | Status: DC | PRN

## 2019-05-23 MED ORDER — IBUPROFEN 600 MG PO TABS: 600.0000 mg | ORAL_TABLET | Freq: Four times a day (QID) | ORAL | 0 refills | Status: DC | PRN

## 2019-05-23 NOTE — ED Provider Notes (Signed)
[facility]    CSN: [id number] Arrival date & time: 05/23/19  1127      History   Chief Complaint Chief Complaint  Patient presents with  . Fall  . Back Pain    HPI Carmen Pierce is a 44 y.o. female.   Patient is accompanied by her daughter to this visit.  Patient is Spanish-speaking, declines Spanish interpreter.  Expresses desire to use her daughter as interpreter.  Daughter reports that the patient fell at work x2 days ago, and that she hit her back on the flushing mechanism of a public toilet.  She is reported right back and neck pain since then.  She denies taking any medication for this at home.  Patient will be, patient denies significant medical history for GERD, and H. pylori infection.  Neither of which she is having any issues at the moment.  Denies headache, sore throat, shortness of breath, nausea, vomiting, diarrhea, rash, chills, fever  ROS per HPI  The history is provided by the patient and a relative.    History reviewed. No pertinent past medical history.  Patient Active Problem List   Diagnosis Date Noted  . High risk medication use 04/09/2017  . H. pylori infection 04/09/2017  . Pap smear for cervical cancer screening 04/12/2013  . Dental caries extending into dentine 02/25/2013  . Mass of left ear auricle 02/25/2013  . GERD (gastroesophageal reflux disease) 02/25/2013  . Mass of right ear auricle 02/25/2013  . NECK PAIN, RIGHT 06/30/2008  . BACK PAIN, RIGHT 06/30/2008  . 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 HEADACHE 04/30/2007  . HEMATURIA, MICROSCOPIC, HX OF 04/30/2007    Past Surgical History:  Procedure Laterality Date  . CESAREAN SECTION      OB History   No obstetric history on file.      Home Medications    Prior to Admission medications   Medication Sig Start Date End Date Taking? Authorizing Provider  bismuth-metronidazole-tetracycline (PYLERA) [phone] MG capsule Take 3 capsules by mouth 4 (four) times  daily -  before meals and at bedtime for 14 days. 10/09/17 10/23/17  Rachael Fee, MD  cyclobenzaprine (FLEXERIL) 10 MG tablet Take 1 tablet (10 mg total) by mouth 2 (two) times daily as needed for muscle spasms. 05/23/19   Moshe Cipro, NP  ibuprofen (ADVIL) 600 MG tablet Take 1 tablet (600 mg total) by mouth every 6 (six) hours as needed. 05/23/19   Moshe Cipro, NP  omeprazole (PRILOSEC) 20 MG capsule Take 1 capsule (20 mg total) by mouth 4 (four) times daily for 14 days. 10/13/17 10/27/17  Rachael Fee, MD    Family History Family History  Problem Relation Age of Onset  . Kidney disease Daughter   . Colon cancer Neg Hx   . Stomach cancer Neg Hx   . Rectal cancer Neg Hx   . Esophageal cancer Neg Hx     Social History Social History   Tobacco Use  . Smoking status: Never Smoker  . Smokeless tobacco: Never Used  Substance Use Topics  . Alcohol use: No  . Drug use: No     Allergies   Patient has no known allergies.   Review of Systems Review of Systems   Physical Exam Triage Vital Signs ED Triage Vitals  Enc Vitals Group     BP 05/23/19 1145 133/88     Pulse Rate 05/23/19 1145 71     Resp 05/23/19 1145 18     Temp 05/23/19  1145 98.1 F (36.7 C)     Temp Source 05/23/19 1145 Oral     SpO2 05/23/19 1145 99 %     Weight --      Height --      Head Circumference --      Peak Flow --      Pain Score 05/23/19 1146 9     Pain Loc --      Pain Edu? --      Excl. in GC? --    No data found.  Updated Vital Signs BP 133/88 (BP Location: Right Arm)   Pulse 71   Temp 98.1 F (36.7 C) (Oral)   Resp 18   LMP 05/23/2019   SpO2 99%   Visual Acuity Right Eye Distance:   Left Eye Distance:   Bilateral Distance:    Right Eye Near:   Left Eye Near:    Bilateral Near:     Physical Exam Vitals and nursing note reviewed.  Constitutional:      General: She is not in acute distress.    Appearance: Normal appearance. She is well-developed and normal  weight. She is not ill-appearing.  HENT:     Head: Normocephalic and atraumatic.     Right Ear: Tympanic membrane normal.     Left Ear: Tympanic membrane normal.     Nose: Nose normal.     Mouth/Throat:     Mouth: Mucous membranes are moist.     Pharynx: Oropharynx is clear.  Eyes:     Conjunctiva/sclera: Conjunctivae normal.  Cardiovascular:     Rate and Rhythm: Normal rate and regular rhythm.     Heart sounds: Normal heart sounds. No murmur.  Pulmonary:     Effort: Pulmonary effort is normal. No respiratory distress.     Breath sounds: Normal breath sounds. No stridor. No wheezing, rhonchi or rales.  Chest:     Chest wall: No tenderness.  Abdominal:     General: Bowel sounds are normal.     Palpations: Abdomen is soft.     Tenderness: There is no abdominal tenderness.  Musculoskeletal:        General: Swelling and tenderness present.     Cervical back: Normal range of motion and neck supple. Tenderness (Right trapezius) present.     Comments: Swelling and tenderness to right trapezius and right latissimus dorsi.  Skin:    General: Skin is warm and dry.     Capillary Refill: Capillary refill takes less than 2 seconds.     Coloration: Skin is not jaundiced or pale.     Findings: No bruising, erythema or rash.  Neurological:     General: No focal deficit present.     Mental Status: She is alert and oriented to person, place, and time.  Psychiatric:        Mood and Affect: Mood normal.        Behavior: Behavior normal.        Thought Content: Thought content normal.      UC Treatments / Results  Labs (all labs ordered are listed, but only abnormal results are displayed) Labs Reviewed - No data to display  EKG   Radiology No results found.  Procedures Procedures (including critical care time)  Medications Ordered in UC Medications - No data to display  Initial Impression / Assessment and Plan / UC Course  I have reviewed the triage vital signs and the nursing  notes.  Pertinent labs & imaging results that were  available during my care of the patient were reviewed by me and considered in my medical decision making (see chart for details).     Fall/back and neck pain: Tenderness noted to palpation at right trapezius and right latissimus dorsi, swelling also noted to these muscles.  There likely in spasm.  There is no bruising or lesions to the area in which she hit her back on the flushing mechanism of public toilet.  Prescribed ibuprofen 600 every 6 hours as needed for pain.  Prescribed Flexeril 10 mg twice daily.  Muscle spasms.  Instructed patient not to drive or operate heavy machinery while taking this medication as it can make him sleepy.  Provided a note for patient to stay out of work for the next 2 days to rest her back and her neck.  Instructed that patient may use over-the-counter creams or rubs, as well as heat or ice for comfort.  Instructed patient to follow-up in the ER for loss of sensation or strength in extremities, trouble swallowing trouble breathing, or other concerning symptoms. Final Clinical Impressions(s) / UC Diagnoses   Final diagnoses:  Fall, initial encounter  Acute right-sided thoracic back pain  Strain of right trapezius muscle, initial encounter  Neck pain     Discharge Instructions     Take the prescribed ibuprofen as needed for your pain.  Take the muscle relaxer Flexeril as needed for muscle spasm; do not drive, operate machinery, or drink alcohol with this medication as it may make you drowsy.    Follow up with an orthopedist if your pain is not improving.  Go to the emergency department if you have worsening pain or develop new symptoms such as difficulty with urination, weakness, numbness, loss of control of your bladder or bowels, fever, chills or other concerns.     ED Prescriptions    Medication Sig Dispense Auth. Provider   ibuprofen (ADVIL) 600 MG tablet Take 1 tablet (600 mg total) by mouth every 6  (six) hours as needed. 30 tablet Faustino Congress, NP   cyclobenzaprine (FLEXERIL) 10 MG tablet Take 1 tablet (10 mg total) by mouth 2 (two) times daily as needed for muscle spasms. 20 tablet Faustino Congress, NP     PDMP not reviewed this encounter.   Faustino Congress, NP 05/24/19 1950

## 2019-05-23 NOTE — ED Triage Notes (Signed)
Pt presents with back pain after a fall on Thursday at work.

## 2019-05-23 NOTE — Discharge Instructions (Signed)
Take the prescribed ibuprofen as needed for your pain.  Take the muscle relaxer Flexeril as needed for muscle spasm; do not drive, operate machinery, or drink alcohol with this medication as it may make you drowsy.    Follow up with an orthopedist if your pain is not improving.  Go to the emergency department if you have worsening pain or develop new symptoms such as difficulty with urination, weakness, numbness, loss of control of your bladder or bowels, fever, chills or other concerns.  

## 2019-06-02 ENCOUNTER — Ambulatory Visit (INDEPENDENT_AMBULATORY_CARE_PROVIDER_SITE_OTHER): Payer: Self-pay | Admitting: Primary Care

## 2019-06-17 ENCOUNTER — Other Ambulatory Visit (HOSPITAL_COMMUNITY): Payer: Self-pay | Admitting: Sports Medicine

## 2019-06-17 DIAGNOSIS — M545 Low back pain, unspecified: Secondary | ICD-10-CM

## 2019-06-29 ENCOUNTER — Other Ambulatory Visit: Payer: Self-pay

## 2019-06-29 ENCOUNTER — Ambulatory Visit (HOSPITAL_COMMUNITY)
Admission: RE | Admit: 2019-06-29 | Discharge: 2019-06-29 | Disposition: A | Discharge status: Home / Self Care | Payer: No Typology Code available for payment source | Source: Ambulatory Visit | Attending: Sports Medicine | Admitting: Sports Medicine

## 2019-06-29 DIAGNOSIS — M545 Low back pain, unspecified: Secondary | ICD-10-CM

## 2019-08-16 ENCOUNTER — Emergency Department (HOSPITAL_COMMUNITY)
Admission: EM | Admit: 2019-08-16 | Discharge: 2019-08-16 | Disposition: A | Discharge status: Home / Self Care | Payer: Self-pay | Attending: Emergency Medicine | Admitting: Emergency Medicine

## 2019-08-16 ENCOUNTER — Other Ambulatory Visit: Payer: Self-pay

## 2019-08-16 ENCOUNTER — Encounter (HOSPITAL_COMMUNITY): Payer: Self-pay | Admitting: Emergency Medicine

## 2019-08-16 ENCOUNTER — Emergency Department (HOSPITAL_COMMUNITY): Payer: Self-pay

## 2019-08-16 DIAGNOSIS — N760 Acute vaginitis: Secondary | ICD-10-CM | POA: Insufficient documentation

## 2019-08-16 DIAGNOSIS — R310 Gross hematuria: Secondary | ICD-10-CM | POA: Insufficient documentation

## 2019-08-16 DIAGNOSIS — R109 Unspecified abdominal pain: Secondary | ICD-10-CM

## 2019-08-16 LAB — URINALYSIS, ROUTINE W REFLEX MICROSCOPIC
Bilirubin Urine: NEGATIVE
Glucose, UA: NEGATIVE mg/dL
Ketones, ur: NEGATIVE mg/dL
Leukocytes,Ua: NEGATIVE
Nitrite: NEGATIVE
Protein, ur: NEGATIVE mg/dL
Specific Gravity, Urine: 1.019 (ref 1.005–1.030)
pH: 5 (ref 5.0–8.0)

## 2019-08-16 LAB — CBC
HCT: 38.8 % (ref 36.0–46.0)
Hemoglobin: 13.2 g/dL (ref 12.0–15.0)
MCH: 32.5 pg (ref 26.0–34.0)
MCHC: 34 g/dL (ref 30.0–36.0)
MCV: 95.6 fL (ref 80.0–100.0)
Platelets: 273 10*3/uL (ref 150–400)
RBC: 4.06 MIL/uL (ref 3.87–5.11)
RDW: 12.6 % (ref 11.5–15.5)
WBC: 6.8 10*3/uL (ref 4.0–10.5)
nRBC: 0 % (ref 0.0–0.2)

## 2019-08-16 LAB — BASIC METABOLIC PANEL
Anion gap: 8 (ref 5–15)
BUN: 8 mg/dL (ref 6–20)
CO2: 21 mmol/L — ABNORMAL LOW (ref 22–32)
Calcium: 8.7 mg/dL — ABNORMAL LOW (ref 8.9–10.3)
Chloride: 109 mmol/L (ref 98–111)
Creatinine, Ser: 0.59 mg/dL (ref 0.44–1.00)
GFR calc Af Amer: >60 mL/min (ref >60)
GFR calc non Af Amer: >60 mL/min (ref >60)
Glucose, Bld: 121 mg/dL — ABNORMAL HIGH (ref 70–99)
Potassium: 3.6 mmol/L (ref 3.5–5.1)
Sodium: 138 mmol/L (ref 135–145)

## 2019-08-16 LAB — WET PREP, GENITAL
Sperm: NONE SEEN
Trich, Wet Prep: NONE SEEN
Yeast Wet Prep HPF POC: NONE SEEN

## 2019-08-16 LAB — I-STAT BETA HCG BLOOD, ED (MC, WL, AP ONLY): I-stat hCG, quantitative: <5 m[IU]/mL (ref <5)

## 2019-08-16 MED ORDER — DOXYCYCLINE HYCLATE 100 MG PO CAPS
100.0000 mg | ORAL_CAPSULE | Freq: Two times a day (BID) | ORAL | 0 refills | Status: DC
Start: 2019-08-16 — End: 2019-10-06

## 2019-08-16 MED ORDER — METRONIDAZOLE 500 MG PO TABS
500.0000 mg | ORAL_TABLET | Freq: Two times a day (BID) | ORAL | 0 refills | Status: DC
Start: 2019-08-16 — End: 2019-10-06

## 2019-08-16 MED ORDER — MORPHINE SULFATE (PF) 4 MG/ML IV SOLN
4.0000 mg | Freq: Once | INTRAVENOUS | Status: AC
  Administered 2019-08-16: 4 mg via INTRAVENOUS
  Filled 2019-08-16: qty 1

## 2019-08-16 MED FILL — DOXYCYCLINE HYCLATE 100 MG: 100 | 7 days supply | Qty: 14 | Fill #0

## 2019-08-16 MED FILL — metroNIDAZOLE 500 MG TABS: 500 | 7 days supply | Qty: 14 | Fill #0

## 2019-08-16 NOTE — ED Notes (Signed)
Patient verbalizes understanding of discharge instructions. Opportunity for questioning and answers were provided. Armband removed by staff, pt discharged from ED to home with family 

## 2019-08-16 NOTE — ED Provider Notes (Signed)
[facility] EMERGENCY DEPARTMENT Provider Note   CSN: [id number] Arrival date & time: 08/16/19  0744     History Chief Complaint  Patient presents with  . Abdominal Pain  . Flank Pain    Carmen Pierce is a 44 y.o. female.  The history is provided by the patient. The history is limited by a language barrier. A language interpreter was used.  Abdominal Pain Flank Pain Associated symptoms include abdominal pain.     44 year old Hispanic female presenting for evaluation of abdominal pain.  History obtained through language interpreter who is at bedside.  Patient report she heard back from falling recently and is currently going through a chiropractor for recurrent back pain.  For the past week she endorsed having intermittent pain to her right lower back.  Pain is sharp, now persistent, worse with movement but present at rest.  Pain sometimes radiates towards her right thigh.  Furthermore she also noticed blood while urinating and noticed clots.  She felt bleeding is from her urine and not from her vagina.  She does not complain of any urinary frequency or urgency or burning urination.  She does endorse some discomfort with sexual activity.  Denies vaginal discharge.  Last menstrual period was approximately a month ago.  Denies any prior history of kidney stone.  She has been taking Tylenol at home without adequate relief.  Her pain is currently moderate in severity.  History reviewed. No pertinent past medical history.  Patient Active Problem List   Diagnosis Date Noted  . High risk medication use 04/09/2017  . H. pylori infection 04/09/2017  . Pap smear for cervical cancer screening 04/12/2013  . Dental caries extending into dentine 02/25/2013  . Mass of left ear auricle 02/25/2013  . GERD (gastroesophageal reflux disease) 02/25/2013  . Mass of right ear auricle 02/25/2013  . NECK PAIN, RIGHT 06/30/2008  . BACK PAIN, RIGHT 06/30/2008  . RIGHT POSTERIOR AURICULAR  CYST 04/30/2007  . HEADACHE 04/30/2007  . HEMATURIA, MICROSCOPIC, HX OF 04/30/2007    Past Surgical History:  Procedure Laterality Date  . CESAREAN SECTION       OB History   No obstetric history on file.     Family History  Problem Relation Age of Onset  . Kidney disease Daughter   . Colon cancer Neg Hx   . Stomach cancer Neg Hx   . Rectal cancer Neg Hx   . Esophageal cancer Neg Hx     Social History   Tobacco Use  . Smoking status: Never Smoker  . Smokeless tobacco: Never Used  Vaping Use  . Vaping Use: Never used  Substance Use Topics  . Alcohol use: No  . Drug use: No    Home Medications Prior to Admission medications   Medication Sig Start Date End Date Taking? Authorizing Provider  bismuth-metronidazole-tetracycline (PYLERA) [phone] MG capsule Take 3 capsules by mouth 4 (four) times daily -  before meals and at bedtime for 14 days. 10/09/17 10/23/17  Rachael Fee, MD  cyclobenzaprine (FLEXERIL) 10 MG tablet Take 1 tablet (10 mg total) by mouth 2 (two) times daily as needed for muscle spasms. 05/23/19   Moshe Cipro, NP  ibuprofen (ADVIL) 600 MG tablet Take 1 tablet (600 mg total) by mouth every 6 (six) hours as needed. 05/23/19   Moshe Cipro, NP  omeprazole (PRILOSEC) 20 MG capsule Take 1 capsule (20 mg total) by mouth 4 (four) times daily for 14 days. 10/13/17 10/27/17  Rachael Fee,  MD    Allergies    Patient has no known allergies.  Review of Systems   Review of Systems  Gastrointestinal: Positive for abdominal pain.  Genitourinary: Positive for flank pain.  All other systems reviewed and are negative.   Physical Exam Updated Vital Signs BP (!) 144/93   Pulse 77   Temp 98.2 F (36.8 C)   Resp 15   LMP 07/23/2019 (Exact Date)   SpO2 100%   Physical Exam Vitals and nursing note reviewed.  Constitutional:      General: She is not in acute distress.    Appearance: She is well-developed.  HENT:     Head: Atraumatic.  Eyes:       Conjunctiva/sclera: Conjunctivae normal.  Cardiovascular:     Rate and Rhythm: Normal rate and regular rhythm.  Pulmonary:     Effort: Pulmonary effort is normal.     Breath sounds: Normal breath sounds.  Abdominal:     General: Abdomen is flat.     Palpations: Abdomen is soft.     Tenderness: There is abdominal tenderness (Very mild diffuse abdominal tenderness without focal point tenderness.). There is right CVA tenderness. There is no guarding or rebound.  Genitourinary:    Comments: Pelvic exam performed with permission of pt and female ED tech assist during exam.  External genitalia w/out lesions.  Vaginal vault with normal functional discharge.  Cervix w/out lesions, not friable, GC/Chlamydia and wet prep obtained and sent to lab.  Bimanual exam w/out CMT, but does have R adnexal tenderness  Musculoskeletal:     Cervical back: Neck supple.     Comments: Tenderness to lumbar and paralumbar spinal muscle without crepitus or step-off.  Negative straight leg raise bilaterally.  Skin:    Findings: No rash.  Neurological:     Mental Status: She is alert.     ED Results / Procedures / Treatments   Labs (all labs ordered are listed, but only abnormal results are displayed) Labs Reviewed  WET PREP, GENITAL - Abnormal; Notable for the following components:      Result Value   Clue Cells Wet Prep HPF POC PRESENT (*)    WBC, Wet Prep HPF POC MANY (*)    All other components within normal limits  URINALYSIS, ROUTINE W REFLEX MICROSCOPIC - Abnormal; Notable for the following components:   Hgb urine dipstick LARGE (*)    Bacteria, UA RARE (*)    All other components within normal limits  BASIC METABOLIC PANEL - Abnormal; Notable for the following components:   CO2 21 (*)    Glucose, Bld 121 (*)    Calcium 8.7 (*)    All other components within normal limits  URINE CULTURE  CBC  I-STAT BETA HCG BLOOD, ED ([facility], [facility], [facility] ONLY)  GC/CHLAMYDIA PROBE AMP ([place]) NOT AT [facility]     EKG None  Radiology CT Renal Stone Study  Result Date: 08/16/2019 CLINICAL DATA:  44 year old female with onset of right low back and flank pain radiating to the abdomen and leg. EXAM: CT ABDOMEN AND PELVIS WITHOUT CONTRAST TECHNIQUE: Multidetector CT imaging of the abdomen and pelvis was performed following the standard protocol without IV contrast. COMPARISON:  Lumbar MRI 06/29/2019. FINDINGS: Lower chest: Negative aside from mild lung base atelectasis. Hepatobiliary: Negative noncontrast liver and gallbladder. Pancreas: Negative. Spleen: Negative. Adrenals/Urinary Tract: Normal adrenal glands. Negative noncontrast kidneys. No nephrolithiasis, hydronephrosis or perinephric stranding. Both ureters seem to be decompressed and normal to the bladder. The bladder  is distended (estimated volume 625 mL) but otherwise unremarkable. Only a solitary right pelvic floor phlebolith is identified. Stomach/Bowel: Redundant large bowel with intermittent retained stool. Normal appendix in the right gutter just below the liver (coronal image 49). No large bowel inflammation. Negative terminal ileum. No dilated small bowel. Decompressed stomach and duodenum. No free air or free fluid. Vascular/Lymphatic: Vascular patency is not evaluated in the absence of IV contrast. No lymphadenopathy. Reproductive: Negative; bilateral tubal ligation clips. Other: No pelvic free fluid. Musculoskeletal: No acute osseous abnormality identified. Lumbar spine appears stable from last month. IMPRESSION: 1. No urinary calculus or obstructive uropathy. Distended urinary bladder, approximately 625 mL. 2. No acute or inflammatory process in the noncontrast abdomen or pelvis. Normal appendix. Electronically Signed   By: Genevie Ann M.D.   On: 08/16/2019 12:48    Procedures Procedures (including critical care time)  Medications Ordered in ED Medications  morphine 4 MG/ML injection 4 mg (4 mg Intravenous Given 08/16/19 1002)    ED Course  I  have reviewed the triage vital signs and the nursing notes.  Pertinent labs & imaging results that were available during my care of the patient were reviewed by me and considered in my medical decision making (see chart for details).    MDM Rules/Calculators/A&P                          BP (!) 144/93   Pulse 77   Temp 98.2 F (36.8 C)   Resp 15   LMP 07/23/2019 (Exact Date)   SpO2 100%   Final Clinical Impression(s) / ED Diagnoses Final diagnoses:  Right flank pain  Gross hematuria  BV (bacterial vaginosis)    Rx / DC Orders ED Discharge Orders         Ordered    doxycycline (VIBRAMYCIN) 100 MG capsule  2 times daily     Discontinue  Reprint     08/16/19 1350    metroNIDAZOLE (FLAGYL) 500 MG tablet  2 times daily     Discontinue  Reprint     08/16/19 1350         8:46 AM Patient complaining of hematuria as well as right flank pain.  No prior history of kidney stone.  She does have history of bloody urine in the past.  She does have some CVA tenderness on exam.  She would benefit from a CT scan to rule out renal stone.  We will also perform pelvic examination to rule out vaginal bleeding.  9:47 AM Pelvic semination revealed no evidence of vaginal bleeding.  Will obtain CT renal stone study to rule out kidney stone causing her symptoms.  1:27 PM Wet prep shows evidence of clue cells as well as many WBC.  She does have right adnexal tenderness on exam without cervical motion tenderness.  Her pregnancy test is negative.  Electrolyte panel are reassuring, normal WBC, normal H&H, as mentioned earlier UA with large amounts of hemoglobin and urine dipsticks but no signs of urinary tract infection.  Her CT scan demonstrate no evidence of renal stone.  She does have a distended urinary bladder approximately 625 mL.  She is able to urinate and she denies any urinary or bowel incontinence.  Low suspicion for bladder outlet obstruction or acute urinary retention.  She has a normal  appendix.  Will discharge home with antibiotics for potential STI, and pt encourage to f/u with urology for recurrent gross hematuria.  Return precaution  discussed.  All questions answered to pt's satisfaction through language interpreter.     Fayrene Helper, PA-C 08/16/19 1352    Tilden Fossa, MD 08/17/19 1339

## 2019-08-16 NOTE — ED Triage Notes (Signed)
Pt reports that she went to physical therapy last week, states that she began having pain to R lower back/flank that radiated around to lower abdomen, reports that pain radiates down her R leg as well. She also endorses that a few days later she noticed bleeding with large clots, she states that it stopped Saturday but then started back yesterday. Denies needing to wear pads or tampons and endorses that she only sees the blood on the toilet paper after urinating. She denies any urinary frequency or dysuria.

## 2019-08-16 NOTE — ED Notes (Signed)
Pelvic done by Greta Doom - PA and Kenney Houseman - EMT assisted.

## 2019-08-16 NOTE — Discharge Instructions (Signed)
You have been evaluated for your flank pain.  You have blood in your urine but no evidence of kidney stone or urinary tract infection.  Please call or follow-up closely with urologist for evaluation as it can contribute to your flank pain.  Antibiotic was given to treat for potential sexually transmitted infection.  Take Tylenol as needed for pain.  Return if you have any concern.

## 2019-08-17 LAB — URINE CULTURE

## 2019-08-17 LAB — GC/CHLAMYDIA PROBE AMP (~~LOC~~) NOT AT ARMC
Chlamydia: NEGATIVE
Comment: NEGATIVE
Comment: NORMAL
Neisseria Gonorrhea: NEGATIVE

## 2019-10-06 ENCOUNTER — Ambulatory Visit: Payer: Self-pay | Attending: Nurse Practitioner | Admitting: Nurse Practitioner

## 2019-10-06 ENCOUNTER — Other Ambulatory Visit: Payer: Self-pay

## 2019-10-06 ENCOUNTER — Encounter: Payer: Self-pay | Admitting: Nurse Practitioner

## 2019-10-06 VITALS — Ht 65.0 in | Wt 170.0 lb

## 2019-10-06 DIAGNOSIS — Z131 Encounter for screening for diabetes mellitus: Secondary | ICD-10-CM

## 2019-10-06 DIAGNOSIS — Z13228 Encounter for screening for other metabolic disorders: Secondary | ICD-10-CM

## 2019-10-06 DIAGNOSIS — Z13220 Encounter for screening for lipoid disorders: Secondary | ICD-10-CM

## 2019-10-06 NOTE — Progress Notes (Signed)
Virtual Visit via Telephone Note Due to national recommendations of social distancing due to [place] 19, telehealth visit is felt to be most appropriate for this patient at this time.  I discussed the limitations, risks, security and privacy concerns of performing an evaluation and management service by telephone and the availability of in person appointments. I also discussed with the patient that there may be a patient responsible charge related to this service. The patient expressed understanding and agreed to proceed.    I connected with Carmen Pierce on 10/06/19  at   1:50 PM EDT  EDT by telephone and verified that I am speaking with the correct person using two identifiers.   Consent I discussed the limitations, risks, security and privacy concerns of performing an evaluation and management service by telephone and the availability of in person appointments. I also discussed with the patient that there may be a patient responsible charge related to this service. The patient expressed understanding and agreed to proceed.   Location of Patient: Private Residence    Location of Provider: [place] and [place] participating in Telemedicine visit: Geryl Rankins FNP-BC [place] Interpreter Anto[place] ID [place]   History of Present Illness: Telemedicine visit for: Establish Care Patient has been counseled on age-appropriate routine health concerns for screening and prevention. These are reviewed and up-to-date. Referrals have been placed accordingly. Immunizations are up-to-date or declined.    Mammogram: referred to [place]   She has no questions or concerns today. Denies chest pain, shortness of breath, palpitations, lightheadedness, dizziness, headaches or BLE edema.     History reviewed. No pertinent past medical history.  Past Surgical History:  Procedure Laterality Date  . CESAREAN SECTION    . CESAREAN SECTION    .  CESAREAN SECTION      Family History  Problem Relation Age of Onset  . Kidney disease Daughter   . Colon cancer Neg Hx   . Stomach cancer Neg Hx   . Rectal cancer Neg Hx   . Esophageal cancer Neg Hx   . Diabetes Neg Hx     Social History   Socioeconomic History  . Marital status: Single    Spouse name: Not on file  . Number of children: Not on file  . Years of education: Not on file  . Highest education level: Not on file  Occupational History  . Not on file  Tobacco Use  . Smoking status: Never Smoker  . Smokeless tobacco: Never Used  Vaping Use  . Vaping Use: Never used  Substance and Sexual Activity  . Alcohol use: No  . Drug use: No  . Sexual activity: Yes  Other Topics Concern  . Not on file  Social History Narrative  . Not on file   Social Determinants of Health   Financial Resource Strain:   . Difficulty of Paying Living Expenses:   Food Insecurity:   . Worried About Charity fundraiser in the Last Year:   . Arboriculturist in the Last Year:   Transportation Needs:   . Film/video editor (Medical):   Marland Kitchen Lack of Transportation (Non-Medical):   Physical Activity:   . Days of Exercise per Week:   . Minutes of Exercise per Session:   Stress:   . Feeling of Stress :   Social Connections:   . Frequency of Communication with Friends and Family:   . Frequency of Social Gatherings with  Friends and Family:   . Attends Religious Services:   . Active Member of Clubs or Organizations:   . Attends Archivist Meetings:   Marland Kitchen Marital Status:      Observations/Objective: Awake, alert and oriented x 3   Review of Systems  Constitutional: Negative for fever, malaise/fatigue and weight loss.  HENT: Negative.  Negative for nosebleeds.   Eyes: Negative.  Negative for blurred vision, double vision and photophobia.  Respiratory: Negative.  Negative for cough and shortness of breath.   Cardiovascular: Negative.  Negative for chest pain, palpitations and  leg swelling.  Gastrointestinal: Negative.  Negative for heartburn, nausea and vomiting.  Musculoskeletal: Negative.  Negative for myalgias.  Neurological: Negative.  Negative for dizziness, focal weakness, seizures and headaches.  Psychiatric/Behavioral: Negative.  Negative for suicidal ideas.    Assessment and Plan: Carmen Pierce was seen today for new patient (initial visit).  Diagnoses and all orders for this visit:  Screening for metabolic disorder -     [id number]+[id number]; Future  Encounter for screening for diabetes mellitus -     Hemoglobin A1c; Future  Lipid screening -     Lipid panel; Future     Follow Up Instructions Return for PAP SMEAR.     I discussed the assessment and treatment plan with the patient. The patient was provided an opportunity to ask questions and all were answered. The patient agreed with the plan and demonstrated an understanding of the instructions.   The patient was advised to call back or seek an in-person evaluation if the symptoms worsen or if the condition fails to improve as anticipated.  I provided 16 minutes of non-face-to-face time during this encounter including median intraservice time, reviewing previous notes, labs, imaging, medications and explaining diagnosis and management.  Gildardo Pounds, FNP-BC

## 2019-10-07 ENCOUNTER — Ambulatory Visit: Payer: Self-pay | Attending: Family Medicine

## 2019-10-07 ENCOUNTER — Other Ambulatory Visit: Payer: Self-pay

## 2019-10-07 DIAGNOSIS — Z131 Encounter for screening for diabetes mellitus: Secondary | ICD-10-CM

## 2019-10-07 DIAGNOSIS — Z13228 Encounter for screening for other metabolic disorders: Secondary | ICD-10-CM

## 2019-10-07 DIAGNOSIS — Z13220 Encounter for screening for lipoid disorders: Secondary | ICD-10-CM

## 2019-10-08 LAB — CMP14+EGFR
ALT: 16 IU/L (ref 0–32)
AST: 18 IU/L (ref 0–40)
Albumin/Globulin Ratio: 1.6 (ref 1.2–2.2)
Albumin: 4.3 g/dL (ref 3.8–4.8)
Alkaline Phosphatase: 78 IU/L (ref 48–121)
BUN/Creatinine Ratio: 11 (ref 9–23)
BUN: 8 mg/dL (ref 6–24)
Bilirubin Total: 0.3 mg/dL (ref 0.0–1.2)
CO2: 24 mmol/L (ref 20–29)
Calcium: 9.2 mg/dL (ref 8.7–10.2)
Chloride: 106 mmol/L (ref 96–106)
Creatinine, Ser: 0.72 mg/dL (ref 0.57–1.00)
GFR calc Af Amer: 119 mL/min/{1.73_m2} (ref >59)
GFR calc non Af Amer: 104 mL/min/{1.73_m2} (ref >59)
Globulin, Total: 2.7 g/dL (ref 1.5–4.5)
Glucose: 84 mg/dL (ref 65–99)
Potassium: 4.5 mmol/L (ref 3.5–5.2)
Sodium: 140 mmol/L (ref 134–144)
Total Protein: 7 g/dL (ref 6.0–8.5)

## 2019-10-08 LAB — LIPID PANEL
Chol/HDL Ratio: 4.2 ratio (ref 0.0–4.4)
Cholesterol, Total: 195 mg/dL (ref 100–199)
HDL: 46 mg/dL (ref >39)
LDL Chol Calc (NIH): 123 mg/dL — ABNORMAL HIGH (ref 0–99)
Triglycerides: 145 mg/dL (ref 0–149)
VLDL Cholesterol Cal: 26 mg/dL (ref 5–40)

## 2019-10-08 LAB — HEMOGLOBIN A1C
Est. average glucose Bld gHb Est-mCnc: 100 mg/dL
Hgb A1c MFr Bld: 5.1 % (ref 4.8–5.6)

## 2019-10-13 ENCOUNTER — Other Ambulatory Visit: Payer: Self-pay | Admitting: Obstetrics and Gynecology

## 2019-10-13 DIAGNOSIS — Z1231 Encounter for screening mammogram for malignant neoplasm of breast: Secondary | ICD-10-CM

## 2019-11-15 ENCOUNTER — Encounter: Payer: Self-pay | Admitting: Nurse Practitioner

## 2019-11-15 ENCOUNTER — Other Ambulatory Visit: Payer: Self-pay

## 2019-11-15 ENCOUNTER — Ambulatory Visit: Payer: Self-pay | Attending: Nurse Practitioner | Admitting: Nurse Practitioner

## 2019-11-15 VITALS — BP 115/77 | HR 70 | Temp 97.7°F | Ht 65.0 in | Wt 186.0 lb

## 2019-11-15 DIAGNOSIS — F32A Depression, unspecified: Secondary | ICD-10-CM

## 2019-11-15 DIAGNOSIS — Z1159 Encounter for screening for other viral diseases: Secondary | ICD-10-CM

## 2019-11-15 DIAGNOSIS — Z124 Encounter for screening for malignant neoplasm of cervix: Secondary | ICD-10-CM

## 2019-11-15 DIAGNOSIS — F320 Major depressive disorder, single episode, mild: Secondary | ICD-10-CM

## 2019-11-15 DIAGNOSIS — Z114 Encounter for screening for human immunodeficiency virus [HIV]: Secondary | ICD-10-CM

## 2019-11-15 MED ORDER — TRAZODONE HCL 50 MG PO TABS: 50.0000 mg | ORAL_TABLET | Freq: Every evening | ORAL | 3 refills | Status: DC | PRN

## 2019-11-15 MED FILL — ?TRAZODONE HCL 50 TABS: 50 | 30 days supply | Qty: 30 | Fill #0

## 2019-11-15 NOTE — Progress Notes (Signed)
Assessment & Plan:  Jorge was seen today for gynecologic exam.  Diagnoses and all orders for this visit:  Encounter for Papanicolaou smear for cervical cancer screening -     Cytology - PAP -     Cervicovaginal ancillary only  Encounter for screening for HIV -     HIV antibody (with reflex)  Need for hepatitis C screening test -     Hepatitis C Antibody  Mild depressive disorder (HCC) -     Ambulatory referral to Integrated Behavioral Health -     traZODone (DESYREL) 50 MG tablet; Take 1 tablet (50 mg total) by mouth at bedtime as needed for sleep. -     TSH    Patient has been counseled on age-appropriate routine health concerns for screening and prevention. These are reviewed and up-to-date. Referrals have been placed accordingly. Immunizations are up-to-date or declined.    Subjective:   Chief Complaint  Patient presents with  . Gynecologic Exam    Pt. is here for pap smear.   HPI   Carmen Pierce 44 y.o. female presents to office today for PAP SMEAR. Endorsing labile mood including episodes of insomnia, tearfulness and feeling lonely. Her depression screening revealed mild depression. Likely situational depression as she reports she has a 69 year old daughter who is on the kidney transplant list, her son is currently separated and her younger children also suffer from chronic health issues as well.  Depression screen [facility] 2/9 11/15/2019 10/06/2019 06/03/2017 04/07/2017 12/03/2016  Decreased Interest 0 0 0 0 0  Down, Depressed, Hopeless 0 0 0 0 0  PHQ - 2 Score 0 0 0 0 0  Altered sleeping 0 - 1 0 -  Tired, decreased energy 0 - 0 1 -  Change in appetite 0 - 0 0 -  Feeling bad or failure about yourself  3 - 0 0 -  Trouble concentrating 0 - 0 0 -  Moving slowly or fidgety/restless 0 - 0 0 -  Suicidal thoughts 3 - 0 0 -  PHQ-9 Score 6 - 1 1 -   GAD 7 : Generalized Anxiety Score 11/15/2019 06/03/2017 04/07/2017 05/23/2016  Nervous, Anxious, on Edge 0 1 0 0  Control/stop  worrying 3 0 0 0  Worry too much - different things 1 0 0 0  Trouble relaxing 1 0 0 0  Restless 0 0 0 0  Easily annoyed or irritable 0 0 0 0  Afraid - awful might happen 0 0 0 0  Total GAD 7 Score 5 1 0 0     Review of Systems  Constitutional: Negative.  Negative for chills, fever, malaise/fatigue and weight loss.  Respiratory: Negative.  Negative for cough, shortness of breath and wheezing.   Cardiovascular: Negative.  Negative for chest pain, orthopnea and leg swelling.  Gastrointestinal: Negative for abdominal pain.  Genitourinary: Negative.  Negative for flank pain.  Skin: Negative.  Negative for rash.  Psychiatric/Behavioral: Negative for suicidal ideas.    History reviewed. No pertinent past medical history.  Past Surgical History:  Procedure Laterality Date  . CESAREAN SECTION    . CESAREAN SECTION    . CESAREAN SECTION      Family History  Problem Relation Age of Onset  . Kidney disease Daughter   . Colon cancer Neg Hx   . Stomach cancer Neg Hx   . Rectal cancer Neg Hx   . Esophageal cancer Neg Hx   . Diabetes Neg Hx  Social History Reviewed with no changes to be made today.   Outpatient Medications Prior to Visit  Medication Sig Dispense Refill  . bismuth-metronidazole-tetracycline (PYLERA) 140-125-125 MG capsule Take 3 capsules by mouth 4 (four) times daily -  before meals and at bedtime for 14 days. 168 capsule 0  . omeprazole (PRILOSEC) 20 MG capsule Take 1 capsule (20 mg total) by mouth 4 (four) times daily for 14 days. 56 capsule 0   No facility-administered medications prior to visit.    No Known Allergies     Objective:    BP 115/77 (BP Location: Right Arm, Patient Position: Sitting, Cuff Size: Large)   Pulse 70   Temp 97.7 F (36.5 C) (Temporal)   Ht 5\' 5"  (1.651 m)   Wt 186 lb (84.4 kg)   LMP 10/31/2019   SpO2 99%   BMI 30.95 kg/m  Wt Readings from Last 3 Encounters:  11/15/19 186 lb (84.4 kg)  10/06/19 170 lb (77.1 kg)  09/24/17  191 lb (86.6 kg)    Physical Exam Exam conducted with a chaperone present.  Constitutional:      Appearance: She is well-developed.  HENT:     Head: Normocephalic.  Cardiovascular:     Rate and Rhythm: Normal rate and regular rhythm.     Heart sounds: Normal heart sounds.  Pulmonary:     Effort: Pulmonary effort is normal.     Breath sounds: Normal breath sounds.  Abdominal:     General: Bowel sounds are normal.     Palpations: Abdomen is soft.     Hernia: There is no hernia in the left inguinal area.  Genitourinary:    Exam position: Lithotomy position.     Labia:        Right: No rash, tenderness, lesion or injury.        Left: No rash, tenderness, lesion or injury.      Vagina: Normal. No signs of injury and foreign body. No vaginal discharge, erythema, tenderness or bleeding.     Cervix: No cervical motion tenderness or friability.     Uterus: Not deviated and not enlarged.      Adnexa:        Right: No mass, tenderness or fullness.         Left: No mass, tenderness or fullness.       Rectum: Normal. No external hemorrhoid.  Lymphadenopathy:     Lower Body: No right inguinal adenopathy. No left inguinal adenopathy.  Skin:    General: Skin is warm and dry.  Neurological:     Mental Status: She is alert and oriented to person, place, and time.  Psychiatric:        Behavior: Behavior normal.        Thought Content: Thought content normal.        Judgment: Judgment normal.          Patient has been counseled extensively about nutrition and exercise as well as the importance of adherence with medications and regular follow-up. The patient was given clear instructions to go to ER or return to medical center if symptoms don't improve, worsen or new problems develop. The patient verbalized understanding.   Follow-up: Return in about 3 weeks (around 12/06/2019), or if symptoms worsen or fail to improve, for Mood disorder-TELE.   12/08/2019, FNP-BC [facility] [place], [place]   11/15/2019, 3:37 PM

## 2019-11-16 LAB — CERVICOVAGINAL ANCILLARY ONLY
Bacterial Vaginitis (gardnerella): POSITIVE — AB
Candida Glabrata: NEGATIVE
Candida Vaginitis: NEGATIVE
Chlamydia: NEGATIVE
Comment: NEGATIVE
Comment: NEGATIVE
Comment: NEGATIVE
Comment: NEGATIVE
Comment: NEGATIVE
Comment: NORMAL
Neisseria Gonorrhea: NEGATIVE
Trichomonas: NEGATIVE

## 2019-11-16 LAB — TSH: TSH: 1.28 u[IU]/mL (ref 0.450–4.500)

## 2019-11-16 LAB — HIV ANTIBODY (ROUTINE TESTING W REFLEX): HIV Screen 4th Generation wRfx: NONREACTIVE

## 2019-11-16 LAB — CYTOLOGY - PAP
Comment: NEGATIVE
Diagnosis: NEGATIVE
High risk HPV: NEGATIVE

## 2019-11-16 LAB — HEPATITIS C ANTIBODY: Hep C Virus Ab: <0.1 s/co ratio (ref 0.0–0.9)

## 2019-11-17 ENCOUNTER — Other Ambulatory Visit: Payer: Self-pay | Admitting: Nurse Practitioner

## 2019-11-17 MED ORDER — METRONIDAZOLE 500 MG PO TABS: 500.0000 mg | ORAL_TABLET | Freq: Two times a day (BID) | ORAL | 0 refills | Status: AC

## 2019-11-18 ENCOUNTER — Ambulatory Visit
Admission: RE | Admit: 2019-11-18 | Discharge: 2019-11-18 | Disposition: A | Discharge status: Home / Self Care | Payer: No Typology Code available for payment source | Source: Ambulatory Visit | Attending: Obstetrics and Gynecology | Admitting: Obstetrics and Gynecology

## 2019-11-18 ENCOUNTER — Other Ambulatory Visit: Payer: Self-pay

## 2019-11-18 ENCOUNTER — Ambulatory Visit: Payer: Self-pay | Admitting: *Deleted

## 2019-11-18 VITALS — BP 110/80 | Temp 97.5°F | Wt 185.6 lb

## 2019-11-18 DIAGNOSIS — Z1231 Encounter for screening mammogram for malignant neoplasm of breast: Secondary | ICD-10-CM

## 2019-11-18 DIAGNOSIS — Z1239 Encounter for other screening for malignant neoplasm of breast: Secondary | ICD-10-CM

## 2019-11-18 MED FILL — ?METRONIDAZOLE 500 MG TABS: 500 | 7 days supply | Qty: 14 | Fill #0

## 2019-11-18 NOTE — Patient Instructions (Signed)
Explained breast self awareness with [name]. Patient did not need a Pap smear today due to last Pap smear and HPV typing was 11/15/2019. Let her know [facility]CP will cover Pap smears and HPV typing every 5 years unless has a history of abnormal Pap smears. Referred patient to the [facility] for a screening mammogram on the mobile unit. Appointment scheduled Thursday, November 18, 2019 at 1550. Patient escorted to the mobile unit following BCCCP appointment for her screening mammogram. Let patient know the Breast Center will follow up with her within the next couple weeks with results of her mammogram by letter or phone. Debraann Aulds verbalized understanding.  Tino Ronan, [name], RN 3:49 PM

## 2019-11-18 NOTE — Progress Notes (Addendum)
Carmen Pierce is a 44 y.o. female who presents to [facility] clinic today with no complaints.    Pap Smear: Pap smear not completed today. Last Pap smear was 11/15/2019 at [facility] and was normal with negative HPV. Per patient has no history of an abnormal Pap smear. Last Pap smear result is available in Epic.   Physical exam: Breasts Breasts symmetrical. No skin abnormalities bilateral breasts. No nipple retraction bilateral breasts. No nipple discharge bilateral breasts. No lymphadenopathy. No lumps palpated bilateral breasts. No complaints of pain or tenderness on exam.       Pelvic/Bimanual Pap is not indicated today per BCCCP guidelines.   Smoking History: Patient has never smoked.   Patient Navigation: Patient education provided. Access to services provided for patient through BCCCP program.    Breast and Cervical Cancer Risk Assessment: Patient does not have family history of breast cancer, known genetic mutations, or radiation treatment to the chest before age 52. Patient does not have history of cervical dysplasia, immunocompromised, or DES exposure in-utero.  Risk Assessment    Risk Scores      11/18/2019   Last edited by: Narda Rutherford, LPN   5-year risk: 0.4 %   Lifetime risk: 5.6 %          A: B[facility]CP exam without pap smear No complaints.  P: Referred patient to the [facility] for a screening mammogram on the mobile unit. Appointment scheduled Thursday, November 18, 2019 at 1550.  Priscille Heidelberg, RN 11/18/2019 3:49 PM

## 2019-11-23 ENCOUNTER — Other Ambulatory Visit: Payer: Self-pay | Admitting: Obstetrics and Gynecology

## 2019-11-23 DIAGNOSIS — R928 Other abnormal and inconclusive findings on diagnostic imaging of breast: Secondary | ICD-10-CM

## 2019-12-07 ENCOUNTER — Ambulatory Visit
Admission: RE | Admit: 2019-12-07 | Discharge: 2019-12-07 | Disposition: A | Discharge status: Home / Self Care | Payer: No Typology Code available for payment source | Source: Ambulatory Visit | Attending: Obstetrics and Gynecology | Admitting: Obstetrics and Gynecology

## 2019-12-07 ENCOUNTER — Other Ambulatory Visit: Payer: Self-pay | Admitting: Obstetrics and Gynecology

## 2019-12-07 ENCOUNTER — Other Ambulatory Visit: Payer: Self-pay

## 2019-12-07 DIAGNOSIS — R928 Other abnormal and inconclusive findings on diagnostic imaging of breast: Secondary | ICD-10-CM

## 2019-12-28 ENCOUNTER — Other Ambulatory Visit: Payer: Self-pay

## 2019-12-28 ENCOUNTER — Encounter: Payer: Self-pay | Admitting: Nurse Practitioner

## 2019-12-28 ENCOUNTER — Ambulatory Visit: Payer: Self-pay | Attending: Nurse Practitioner | Admitting: Nurse Practitioner

## 2019-12-28 DIAGNOSIS — Z87898 Personal history of other specified conditions: Secondary | ICD-10-CM

## 2019-12-28 NOTE — Progress Notes (Signed)
Virtual Visit via Telephone Note Due to national recommendations of social distancing due to COVID 19, telehealth visit is felt to be most appropriate for this patient at this time.  I discussed the limitations, risks, security and privacy concerns of performing an evaluation and management service by telephone and the availability of in person appointments. I also discussed with the patient that there may be a patient responsible charge related to this service. The patient expressed understanding and agreed to proceed.    I connected with Carmen Pierce on 12/28/19  at   3:50 PM EST  EDT by telephone and verified that I am speaking with the correct person using two identifiers.   Consent I discussed the limitations, risks, security and privacy concerns of performing an evaluation and management service by telephone and the availability of in person appointments. I also discussed with the patient that there may be a patient responsible charge related to this service. The patient expressed understanding and agreed to proceed.   Location of Patient: Private Residence   [facility] of Provider: [facility]    Persons participating in Telemedicine visit: Bertram Denver FNP-BC [place] [place] CMA Haydan Cieslik  Spanish interpreter ID# [id number]   History of Present Illness: Telemedicine visit for: F/U Mammogram  She states she is still experiencing some slight pain in the left breast but no newly identifiable masses. Also denies any new areas of concern with either breast. I have instructed her to give [place] a call if she discovers any new masses or pain worsens. Recommended ibuprofen for discomfort. She has no additional questions today.   LEFT BREAST US AND DIAGNOSTIC MAMMOGRAM 12-07-2019 Fibrocystic changes. 6 x 6 x 4 mm probably benign mass in the left breast at 11 o'clock, 6 cm from the nipple, thought to correlate with the mammographic finding, probably a cluster of  cysts. RECOMMENDATION: Recommend six-month follow-up ultrasound and mammogram of the probably benign left breast mass at 12 o'clock, 7 cm from the nipple.   History reviewed. No pertinent past medical history.  Past Surgical History:  Procedure Laterality Date   CESAREAN SECTION     CESAREAN SECTION     CESAREAN SECTION      Family History  Problem Relation Age of Onset   Kidney disease Daughter    Colon cancer Neg Hx    Stomach cancer Neg Hx    Rectal cancer Neg Hx    Esophageal cancer Neg Hx    Diabetes Neg Hx     Social History   Socioeconomic History   Marital status: Single    Spouse name: Not on file   Number of children: 6   Years of education: Not on file   Highest education level: Never attended school  Occupational History   Not on file  Tobacco Use   Smoking status: Never Smoker   Smokeless tobacco: Never Used  Vaping Use   Vaping Use: Never used  Substance and Sexual Activity   Alcohol use: No   Drug use: No   Sexual activity: Yes    Birth control/protection: None  Other Topics Concern   Not on file  Social History Narrative   Not on file   Social Determinants of Health   Financial Resource Strain:    Difficulty of Paying Living Expenses: Not on file  Food Insecurity:    Worried About Running Out of Food in the Last Year: Not on file   [employer] of Food in the Last Year:  Not on file  Transportation Needs: No Transportation Needs   Lack of Transportation (Medical): No   Lack of Transportation (Non-Medical): No  Physical Activity:    Days of Exercise per Week: Not on file   Minutes of Exercise per Session: Not on file  Stress:    Feeling of Stress : Not on file  Social Connections:    Frequency of Communication with Friends and Family: Not on file   Frequency of Social Gatherings with Friends and Family: Not on file   Attends Religious Services: Not on file   Active Member of Clubs or Organizations: Not on  file   Attends Banker Meetings: Not on file   Marital Status: Not on file     Observations/Objective: Awake, alert and oriented x 3   Review of Systems  Constitutional: Negative for fever, malaise/fatigue and weight loss.  HENT: Negative.  Negative for nosebleeds.   Eyes: Negative.  Negative for blurred vision, double vision and photophobia.  Respiratory: Negative.  Negative for cough and shortness of breath.   Cardiovascular: Negative.  Negative for chest pain, palpitations and leg swelling.  Gastrointestinal: Negative.  Negative for heartburn, nausea and vomiting.  Musculoskeletal: Negative.  Negative for myalgias.  Skin:       SEE HPI  Neurological: Negative.  Negative for dizziness, focal weakness, seizures and headaches.  Psychiatric/Behavioral: Negative.  Negative for suicidal ideas.    Assessment and Plan: Carmen Pierce was seen today for left breast discomfort.  Diagnoses and all orders for this visit:  History of breast problem Stable Repeat imaging in April 2022   Follow Up Instructions Return if symptoms worsen or fail to improve.     I discussed the assessment and treatment plan with the patient. The patient was provided an opportunity to ask questions and all were answered. The patient agreed with the plan and demonstrated an understanding of the instructions.   The patient was advised to call back or seek an in-person evaluation if the symptoms worsen or if the condition fails to improve as anticipated.  I provided 12 minutes of non-face-to-face time during this encounter including median intraservice time, reviewing previous notes, labs, imaging, medications and explaining diagnosis and management.  Claiborne Rigg, FNP-BC

## 2020-01-12 ENCOUNTER — Other Ambulatory Visit: Payer: Self-pay

## 2020-01-12 ENCOUNTER — Ambulatory Visit: Payer: Self-pay | Attending: Nurse Practitioner | Admitting: Licensed Clinical Social Worker

## 2020-01-12 DIAGNOSIS — F320 Major depressive disorder, single episode, mild: Secondary | ICD-10-CM

## 2020-01-12 DIAGNOSIS — F32A Depression, unspecified: Secondary | ICD-10-CM

## 2020-01-17 NOTE — BH Specialist Note (Signed)
Integrated Behavioral Health Initial In-Person Visit  MRN: [id number] Name: Carmen Pierce  Number of Integrated Behavioral Health Clinician visits:: 1/6 Session Start time: 4:25 PM  Session End time: 4:45 PM Total time: 20 minutes  Types of Service: Individual psychotherapy  Interpretor:Yes.   Interpretor Name and Language: Laverna Peace   Subjective: Carmen Pierce is a 44 y.o. female accompanied by self Patient was referred by NP Meredeth Ide for depression. Patient reports the following symptoms/concerns: Pt reports decrease in depression symptoms after initiating medications to assist with sleep. States "I don't feel the desperation, like I can't escape anymore" Triggers included daughter's health conditions and son's separation from spouse Duration of problem: Ongoing; Severity of problem: mild  Objective: Mood: Pleasant and Affect: Appropriate Risk of harm to self or others: No plan to harm self or others  Life Context: Family and Social: Pt receives support from five children. She receives encouragement from church School/Work:  Self-Care: Pt participated in med management to assist with sleep. She states that she has not needed to continue medications as she is sleeping better without assistance Life Changes: Pt is coping with stress associated with family and their health  Patient and/or Family's Strengths/Protective Factors: Social connections, Social and Emotional competence, Concrete supports in place (healthy food, safe environments, etc.) and Sense of purpose  Goals Addressed: Patient will: 1. Increase knowledge and/or ability of: stress reduction Pt agreed to utilize identified self-care strategies to manage symptoms    Progress towards Goals: Ongoing  Interventions: Interventions utilized: Solution-Focused Strategies  Standardized Assessments completed: GAD-7 and PHQ 2&9  Patient Response: Pt was engaged during session and was successful in identifying  strategies to improve self-care.   Patient Centered Plan: Patient is on the following Treatment Plan(s):  Depression and Anxiety  Assessment: Patient currently experiencing decrease in depression and anxiety symptoms. Pt reports that she has been intentional on practicing gratitude thinking which has improved mood, in addition, to obtaining quality sleep.   Patient may benefit from continued medication management. Self-care strategies discussed.   Plan: 1. Follow up with behavioral health clinician on : Contact LCSW with any additional behavioral health and/or resource needs 2. Behavioral recommendations: Utilize strategies discussed and continue with medication management 3. Referral(s): Integrated [employer] (In Clinic) 4. "From scale of 1-10, how likely are you to follow plan?":   Bridgett Larsson, LCSW 01/17/2020 1:51 PM

## 2020-03-07 ENCOUNTER — Ambulatory Visit: Payer: Self-pay | Admitting: *Deleted

## 2020-03-07 NOTE — Telephone Encounter (Signed)
Patient connected to triage by agent-  with interpreter on line.  Patient complains of R upper abdominal pain and burning for 1 week- pain is severe. Patient states she has nausea and is unable to eat. Patient states she also has hematuria at this time. Advised ED for evaluation per protocol.  Reason for Disposition . [1] SEVERE pain (e.g., excruciating) AND [2] present > 1 hour  Answer Assessment - Initial Assessment Questions 1. LOCATION: "Where does it hurt?"      R under ribs 2. RADIATION: "Does the pain shoot anywhere else?" (e.g., chest, back)     Goes to back and chest - makes her nauseous 3. ONSET: "When did the pain begin?" (e.g., minutes, hours or days ago)      Pain started 1 week 4. SUDDEN: "Gradual or sudden onset?"     sudden 5. PATTERN "Does the pain come and go, or is it constant?"    - If constant: "Is it getting better, staying the same, or worsening?"      (Note: Constant means the pain never goes away completely; most serious pain is constant and it progresses)     - If intermittent: "How long does it last?" "Do you have pain now?"     (Note: Intermittent means the pain goes away completely between bouts)     Friday- constant 6. SEVERITY: "How bad is the pain?"  (e.g., Scale 1-10; mild, moderate, or severe)    - MILD (1-3): doesn't interfere with normal activities, abdomen soft and not tender to touch     - MODERATE (4-7): interferes with normal activities or awakens from sleep, tender to touch     - SEVERE (8-10): excruciating pain, doubled over, unable to do any normal activities       Severe  7. RECURRENT SYMPTOM: "Have you ever had this type of stomach pain before?" If Yes, ask: "When was the last time?" and "What happened that time?"      no 8. AGGRAVATING FACTORS: "Does anything seem to cause this pain?" (e.g., foods, stress, alcohol)     Eating causes pain and nausea 9. CARDIAC SYMPTOMS: "Do you have any of the following symptoms: chest pain, difficulty  breathing, sweating, nausea?"     nausea 10. OTHER SYMPTOMS: "Do you have any other symptoms?" (e.g., fever, vomiting, diarrhea)       Blood with urination- 1 week 11. PREGNANCY: "Is there any chance you are pregnant?" "When was your last menstrual period?"       No- lmp-02/19/20  Protocols used: ABDOMINAL PAIN - UPPER-A-AH

## 2020-03-09 NOTE — Telephone Encounter (Signed)
Noted. CMA agreed. CMA called patient and informed she will need to go Emergency department to be evaluated.  [place] interpreter assist w/ the call.

## 2020-06-07 ENCOUNTER — Other Ambulatory Visit: Payer: Self-pay | Admitting: Obstetrics and Gynecology

## 2020-06-07 ENCOUNTER — Ambulatory Visit
Admission: RE | Admit: 2020-06-07 | Discharge: 2020-06-07 | Disposition: A | Discharge status: Home / Self Care | Payer: No Typology Code available for payment source | Source: Ambulatory Visit | Attending: Obstetrics and Gynecology | Admitting: Obstetrics and Gynecology

## 2020-06-07 ENCOUNTER — Other Ambulatory Visit: Payer: Self-pay

## 2020-06-07 DIAGNOSIS — R928 Other abnormal and inconclusive findings on diagnostic imaging of breast: Secondary | ICD-10-CM

## 2020-11-10 ENCOUNTER — Other Ambulatory Visit: Payer: Self-pay

## 2020-11-10 ENCOUNTER — Encounter: Payer: Self-pay | Admitting: Nurse Practitioner

## 2020-11-10 ENCOUNTER — Ambulatory Visit: Payer: Self-pay | Attending: Nurse Practitioner | Admitting: Nurse Practitioner

## 2020-11-10 VITALS — BP 123/82 | HR 74 | Ht 65.0 in | Wt 195.0 lb

## 2020-11-10 DIAGNOSIS — K219 Gastro-esophageal reflux disease without esophagitis: Secondary | ICD-10-CM

## 2020-11-10 DIAGNOSIS — G629 Polyneuropathy, unspecified: Secondary | ICD-10-CM

## 2020-11-10 MED ORDER — OMEPRAZOLE 20 MG PO CPDR
20.0000 mg | DELAYED_RELEASE_CAPSULE | Freq: Every day | ORAL | 1 refills | Status: DC
  Filled 2020-11-10 – 2020-11-23 (×2): qty 30, 30d supply, fill #0

## 2020-11-10 MED ORDER — GABAPENTIN 300 MG PO CAPS
300.0000 mg | ORAL_CAPSULE | Freq: Three times a day (TID) | ORAL | 3 refills | Status: DC
  Filled 2020-11-10 – 2020-11-23 (×2): qty 90, 30d supply, fill #0

## 2020-11-10 NOTE — Progress Notes (Signed)
Carmen Pierce [id number]

## 2020-11-10 NOTE — Progress Notes (Signed)
Assessment & Plan:  Carmen Pierce was seen today for pain.  Diagnoses and all orders for this visit:  Neuropathy -     gabapentin (NEURONTIN) 300 MG capsule; Take 1 capsule (300 mg total) by mouth 3 (three) times daily. -     Hemoglobin A1c -     CMP14+EGFR -     CBC -     VITAMIN D 25 Hydroxy (Vit-D Deficiency, Fractures)  Gastroesophageal reflux disease without esophagitis -     omeprazole (PRILOSEC) 20 MG capsule; Take 1 capsule (20 mg total) by mouth daily. INSTRUCTIONS: Avoid GERD Triggers: acidic, spicy or fried foods, caffeine, coffee, sodas,  alcohol and chocolate.     Patient has been counseled on age-appropriate routine health concerns for screening and prevention. These are reviewed and up-to-date. Referrals have been placed accordingly. Immunizations are up-to-date or declined.    Subjective:   Chief Complaint  Patient presents with   Pain    In feet x 4 days   HPI Carmen Pierce 45 y.o. female presents to office today with complaints of neuropathy   VRI was used to communicate directly with patient for the entire encounter including providing detailed patient instructions.   . She has a past medical history of GERD (gastroesophageal reflux disease) and Insomnia.   Neuropathy: She describes symptoms of numbness, burning, hypersensitivity and cramping. Onset of symptoms was  2 months ago . Symptoms are currently of moderate severity. Symptoms occur all day and last minutes to hours. The patient denies lancinating pain. Symptoms are symmetric.  She denies  dry eyes and dry mouth, blurred vision, and lack of sweating. Previous treatment has included NONE    Review of Systems  Constitutional:  Negative for fever, malaise/fatigue and weight loss.  HENT: Negative.  Negative for nosebleeds.   Eyes: Negative.  Negative for blurred vision, double vision and photophobia.  Respiratory: Negative.  Negative for cough and shortness of breath.   Cardiovascular: Negative.  Negative  for chest pain, palpitations and leg swelling.  Gastrointestinal:  Positive for heartburn. Negative for nausea and vomiting.  Musculoskeletal: Negative.  Negative for myalgias.  Neurological:  Positive for tingling and sensory change. Negative for dizziness, focal weakness, seizures and headaches.  Psychiatric/Behavioral: Negative.  Negative for suicidal ideas.    Past Medical History:  Diagnosis Date   GERD (gastroesophageal reflux disease)    Insomnia     Past Surgical History:  Procedure Laterality Date   CESAREAN SECTION     CESAREAN SECTION     CESAREAN SECTION      Family History  Problem Relation Age of Onset   Kidney disease Daughter    Colon cancer Neg Hx    Stomach cancer Neg Hx    Rectal cancer Neg Hx    Esophageal cancer Neg Hx    Diabetes Neg Hx     Social History Reviewed with no changes to be made today.   Outpatient Medications Prior to Visit  Medication Sig Dispense Refill   traZODone (DESYREL) 50 MG tablet Take 1 tablet (50 mg total) by mouth at bedtime as needed for sleep. 30 tablet 3   omeprazole (PRILOSEC) 20 MG capsule Take 1 capsule (20 mg total) by mouth 4 (four) times daily for 14 days. 56 capsule 0   No facility-administered medications prior to visit.    No Known Allergies     Objective:    BP 123/82   Pulse 74   Ht 5' 5"  (1.651 m)   Wt  195 lb (88.5 kg)   LMP 10/28/2020 (Exact Date)   SpO2 97%   BMI 32.45 kg/m  Wt Readings from Last 3 Encounters:  11/10/20 195 lb (88.5 kg)  11/18/19 185 lb 9.6 oz (84.2 kg)  11/15/19 186 lb (84.4 kg)    Physical Exam Vitals and nursing note reviewed.  Constitutional:      Appearance: She is well-developed.  HENT:     Head: Normocephalic and atraumatic.  Cardiovascular:     Rate and Rhythm: Normal rate and regular rhythm.     Pulses:          Dorsalis pedis pulses are 2+ on the right side and 2+ on the left side.       Posterior tibial pulses are 1+ on the right side and 1+ on the left side.      Heart sounds: Normal heart sounds. No murmur heard.   No friction rub. No gallop.  Pulmonary:     Effort: Pulmonary effort is normal. No tachypnea or respiratory distress.     Breath sounds: Normal breath sounds. No decreased breath sounds, wheezing, rhonchi or rales.  Chest:     Chest wall: No tenderness.  Abdominal:     General: Bowel sounds are normal.     Palpations: Abdomen is soft.  Musculoskeletal:        General: Normal range of motion.     Cervical back: Normal range of motion.  Feet:     Right foot:     Protective Sensation: 10 sites tested.  10 sites sensed.     Left foot:     Protective Sensation: 10 sites tested.  10 sites sensed.  Skin:    General: Skin is warm and dry.  Neurological:     Mental Status: She is alert and oriented to person, place, and time.     Coordination: Coordination normal.  Psychiatric:        Behavior: Behavior normal. Behavior is cooperative.        Thought Content: Thought content normal.        Judgment: Judgment normal.         Patient has been counseled extensively about nutrition and exercise as well as the importance of adherence with medications and regular follow-up. The patient was given clear instructions to go to ER or return to medical center if symptoms don't improve, worsen or new problems develop. The patient verbalized understanding.   Follow-up: Return for tele any tuesday in october in 2-3 weeks.   Gildardo Pounds, FNP-BC [facility] and [facility], [place]   11/10/2020, 2:33 PM

## 2020-11-11 LAB — CMP14+EGFR
ALT: 24 IU/L (ref 0–32)
AST: 20 IU/L (ref 0–40)
Albumin/Globulin Ratio: 1.6 (ref 1.2–2.2)
Albumin: 4.4 g/dL (ref 3.8–4.8)
Alkaline Phosphatase: 74 IU/L (ref 44–121)
BUN/Creatinine Ratio: 13 (ref 9–23)
BUN: 8 mg/dL (ref 6–24)
Bilirubin Total: 0.2 mg/dL (ref 0.0–1.2)
CO2: 22 mmol/L (ref 20–29)
Calcium: 9.7 mg/dL (ref 8.7–10.2)
Chloride: 102 mmol/L (ref 96–106)
Creatinine, Ser: 0.6 mg/dL (ref 0.57–1.00)
Globulin, Total: 2.8 g/dL (ref 1.5–4.5)
Glucose: 113 mg/dL — ABNORMAL HIGH (ref 65–99)
Potassium: 3.5 mmol/L (ref 3.5–5.2)
Sodium: 139 mmol/L (ref 134–144)
Total Protein: 7.2 g/dL (ref 6.0–8.5)
eGFR: 114 mL/min/{1.73_m2} (ref >59)

## 2020-11-11 LAB — CBC
Hematocrit: 37.3 % (ref 34.0–46.6)
Hemoglobin: 12.7 g/dL (ref 11.1–15.9)
MCH: 32 pg (ref 26.6–33.0)
MCHC: 34 g/dL (ref 31.5–35.7)
MCV: 94 fL (ref 79–97)
Platelets: 254 10*3/uL (ref 150–450)
RBC: 3.97 x10E6/uL (ref 3.77–5.28)
RDW: 12 % (ref 11.7–15.4)
WBC: 8.9 10*3/uL (ref 3.4–10.8)

## 2020-11-11 LAB — HEMOGLOBIN A1C
Est. average glucose Bld gHb Est-mCnc: 105 mg/dL
Hgb A1c MFr Bld: 5.3 % (ref 4.8–5.6)

## 2020-11-11 LAB — VITAMIN D 25 HYDROXY (VIT D DEFICIENCY, FRACTURES): Vit D, 25-Hydroxy: 12.8 ng/mL — ABNORMAL LOW (ref 30.0–100.0)

## 2020-11-12 ENCOUNTER — Other Ambulatory Visit: Payer: Self-pay | Admitting: Nurse Practitioner

## 2020-11-12 MED ORDER — VITAMIN D (ERGOCALCIFEROL) 1.25 MG (50000 UNIT) PO CAPS
50000.0000 [IU] | ORAL_CAPSULE | ORAL | 1 refills | Status: DC
  Filled 2020-11-12: qty 12, 84d supply, fill #0

## 2020-11-13 ENCOUNTER — Other Ambulatory Visit: Payer: Self-pay

## 2020-11-13 NOTE — Progress Notes (Signed)
Called pt and left a vm to return call to office

## 2020-11-16 ENCOUNTER — Telehealth: Payer: Self-pay

## 2020-11-16 NOTE — Telephone Encounter (Signed)
Called pt and left a vm to return call to office 

## 2020-11-16 NOTE — Telephone Encounter (Signed)
-----   Message from Claiborne Rigg, NP sent at 11/12/2020  1:22 AM EDT ----- CBC does not indicate any anemia or bleeding disorders. A1c does not show diabetes or prediabetes. Kidney, liver function and electrolytes are normal.  Vitamin D is low. Will send script for weekly vitamin D that you will take for 3 months. After that you can purchase Vitamin D over the counter 2000 units daily.

## 2020-11-16 NOTE — Progress Notes (Signed)
Called and left vm to return call to the office 

## 2020-11-17 ENCOUNTER — Other Ambulatory Visit: Payer: Self-pay

## 2020-11-20 ENCOUNTER — Other Ambulatory Visit: Payer: Self-pay

## 2020-11-23 ENCOUNTER — Other Ambulatory Visit: Payer: Self-pay

## 2020-11-27 ENCOUNTER — Other Ambulatory Visit: Payer: Self-pay

## 2020-11-30 ENCOUNTER — Ambulatory Visit: Payer: Self-pay | Admitting: *Deleted

## 2020-11-30 ENCOUNTER — Other Ambulatory Visit: Payer: Self-pay

## 2020-11-30 VITALS — BP 122/84 | Wt 193.2 lb

## 2020-11-30 DIAGNOSIS — Z1239 Encounter for other screening for malignant neoplasm of breast: Secondary | ICD-10-CM

## 2020-11-30 NOTE — Patient Instructions (Signed)
Explained breast self awareness with [name]. Patient did not need a Pap smear today due to last Pap smear and HPV typing was 11/15/2019. Let her know BCCCP will cover Pap smears and HPV typing every 5 years unless has a history of abnormal Pap smears. Referred patient to the [facility] for a diagnostic mammogram per recommendation. Appointment scheduled Thursday, December 14, 2020 at 1340. Patient aware of appointment and will be there. Carmen Pierce verbalized understanding.  [name], [name], RN 1:56 PM

## 2020-11-30 NOTE — Progress Notes (Signed)
[name] is a 45 y.o. female who presents to [facility] clinic today with complaint of left breast diffuse pain since the end of April 2022 that comes and goes. Patient rates the pain at a 2 out of 10. Patient had a left breast diagnostic mammogram completed 06/07/2020 that 49-month diagnostic mammogram and left breast ultrasound are recommended for follow-up.   Pap Smear: Pap smear not completed today. Last Pap smear was 11/15/2019 at [facility] and was normal with negative HPV. Per patient has no history of an abnormal Pap smear. Last Pap smear result is available in Epic.   Physical exam: Breasts Breasts symmetrical. No skin abnormalities bilateral breasts. No nipple retraction bilateral breasts. No nipple discharge bilateral breasts. No lymphadenopathy. No lumps palpated bilateral breasts. No complaints of pain or tenderness on exam.      MM DIAG BREAST TOMO UNI LEFT  Result Date: 06/07/2020 CLINICAL DATA:  Short-term follow-up for a probably benign mass in the left breast, initially assessed on 12/07/2019. EXAM: DIGITAL DIAGNOSTIC UNILATERAL LEFT MAMMOGRAM WITH TOMOSYNTHESIS AND CAD; ULTRASOUND LEFT BREAST LIMITED TECHNIQUE: Left digital diagnostic mammography and breast tomosynthesis was performed. The images were evaluated with computer-aided detection.; Targeted ultrasound examination of the left breast was performed COMPARISON:  11/18/2019 and 12/07/2019. ACR Breast Density Category c: The breast tissue is heterogeneously dense, which may obscure small masses. FINDINGS: The small mass in the posterior slightly upper inner left breast, is unchanged. The larger circumscribed mass in the retroareolar left breast, shown to be a benign cyst, is also stable. There are no new masses, no areas of architectural distortion and no suspicious calcifications. Targeted ultrasound is performed, showing a small cystic lesion in the left breast at 11 o'clock, 6 cm the  nipple, posterior depth, measuring 6 x 2 x 6 mm, without significant change from the prior exam. IMPRESSION: 1. Probably benign small mass, most likely complicated cyst/apocrine cyst, in the left breast at 11 o'clock, 6 cm the nipple, stable for 6 months. Additional short-term follow-up recommended. RECOMMENDATION: Diagnostic mammography and left breast ultrasound in 6 months. I have discussed the findings and recommendations with the patient. If applicable, a reminder letter will be sent to the patient regarding the next appointment. BI-RADS CATEGORY  3: Probably benign. Electronically Signed   By: Carmen Pierce M.D.   On: 06/07/2020 16:21   MS DIGITAL SCREENING TOMO BILATERAL  Result Date: 11/22/2019 CLINICAL DATA:  Screening. EXAM: DIGITAL SCREENING BILATERAL MAMMOGRAM WITH TOMO AND CAD COMPARISON:  None. ACR Breast Density Category c: The breast tissue is heterogeneously dense, which may obscure small masses. FINDINGS: In the left breast, a possible mass warrants further evaluation. In the right breast, no findings suspicious for malignancy. Images were processed with CAD. IMPRESSION: Further evaluation is suggested for a possible mass in the left breast. RECOMMENDATION: Diagnostic mammogram and possibly ultrasound of the left breast. (Code:[id number]-L-21M) The patient will be contacted regarding the findings, and additional imaging will be scheduled. BI-RADS CATEGORY  0: Incomplete. Need additional imaging evaluation and/or prior mammograms for comparison. Electronically Signed   By: Carmen Pierce M.D.   On: 11/22/2019 12:52   MS DIGITAL DIAG TOMO UNI LEFT  Result Date: 12/07/2019 CLINICAL DATA:  The patient was called back for left breast mass. EXAM: DIGITAL DIAGNOSTIC LEFT MAMMOGRAM WITH TOMO ULTRASOUND LEFT BREAST COMPARISON:  Previous exam(s). ACR Breast Density Category c: The breast tissue is heterogeneously dense, which may obscure small masses. FINDINGS: The mass in  the slightly medial superior  left breast persists on additional imaging. There is an oval mass in the anteromedial left breast. No other suspicious findings. On physical exam, no suspicious lumps are identified. Targeted ultrasound is performed, showing a simple cyst in the left breast at 11:30, 3 cm from the nipple. Another small mildly complicated cyst is seen at 12 o'clock, 7 cm from the nipple measuring 4 mm. A probably benign mass at 11 o'clock, 6 cm from the nipple is thought to correlate with the mass for which the patient was called back, measuring 6 by 6 x 4 mm. On real-time imaging, this appears to represent a cluster of cysts. IMPRESSION: Fibrocystic changes. 6 x 6 x 4 mm probably benign mass in the left breast at 11 o'clock, 6 cm from the nipple, thought to correlate with the mammographic finding, probably a cluster of cysts. RECOMMENDATION: Recommend six-month follow-up ultrasound and mammogram of the probably benign left breast mass at 12 o'clock, 7 cm from the nipple. I have discussed the findings and recommendations with the patient. If applicable, a reminder letter will be sent to the patient regarding the next appointment. BI-RADS CATEGORY  3: Probably benign. Electronically Signed   By: Carmen Pierce M.D   On: 12/07/2019 16:34     Pelvic/Bimanual Pap is not indicated today per BCCCP guidelines.   Smoking History: Patient has never smoked.   Patient Navigation: Patient education provided. Access to services provided for patient through [facility] program. Spanish interpreter Carmen Pierce from [facility] provided.   Breast and Cervical Cancer Risk Assessment: Patient does not have family history of breast cancer, known genetic mutations, or radiation treatment to the chest before age 58. Patient does not have history of cervical dysplasia, immunocompromised, or DES exposure in-utero.  Risk Assessment   No risk assessment data for the current encounter  Risk Scores       11/18/2019   Last edited by: Carmen Rutherford, LPN   5-year risk: 0.4 %   Lifetime risk: 5.6 %            A: BCCCP exam without pap smear Complaint of left breast pain.  P: Referred patient to the [facility] for a diagnostic mammogram per recommendation. Appointment scheduled Thursday, December 14, 2020 at 1340.  Priscille Heidelberg, RN 11/30/2020 1:56 PM

## 2020-12-14 ENCOUNTER — Ambulatory Visit
Admission: RE | Admit: 2020-12-14 | Discharge: 2020-12-14 | Disposition: A | Discharge status: Home / Self Care | Payer: No Typology Code available for payment source | Source: Ambulatory Visit | Attending: Obstetrics and Gynecology | Admitting: Obstetrics and Gynecology

## 2020-12-14 ENCOUNTER — Other Ambulatory Visit: Payer: Self-pay

## 2020-12-14 DIAGNOSIS — R928 Other abnormal and inconclusive findings on diagnostic imaging of breast: Secondary | ICD-10-CM

## 2021-09-24 ENCOUNTER — Ambulatory Visit: Payer: Self-pay

## 2021-09-24 NOTE — Telephone Encounter (Signed)
Using [employer] [id number].   Chief Complaint: Abdominal pain all over Symptoms:  A little pain with urination, blood on tissue when wipe from urination, no abdominal pain at present, yesterday pain 8 Frequency: Onset yesterday morning Pertinent Negatives: Patient denies other symptoms Disposition: [] ED /[] Urgent Care (no appt availability in office) / [] Appointment(In office/virtual)/ []  [facility]/ [] Home Care/ [] Refused Recommended Disposition /[x] [place] [facility]/ []  Follow-up with PCP Additional Notes: Only wanted to see PCP in the office, advised no availability, advised [facility] tomorrow, location/time provided, patient agreed to go to the mobile bus.    Reason for Disposition  Blood in urine (red, pink, or tea-colored)  Answer Assessment - Initial Assessment Questions 1. LOCATION: "Where does it hurt?"      All over 2. RADIATION: "Does the pain shoot anywhere else?" (e.g., chest, back)     Goes to right hip 3. ONSET: "When did the pain begin?" (e.g., minutes, hours or days ago)      Yesterday 4. SUDDEN: "Gradual or sudden onset?"     Sudden when woke up 5. PATTERN "Does the pain come and go, or is it constant?"    - If it comes and goes: "How long does it last?" "Do you have pain now?"     (Note: Comes and goes means the pain is intermittent. It goes away completely between bouts.)    - If constant: "Is it getting better, staying the same, or getting worse?"      (Note: Constant means the pain never goes away completely; most serious pain is constant and gets worse.)      Comes and goes 6. SEVERITY: "How bad is the pain?"  (e.g., Scale 1-10; mild, moderate, or severe)    - MILD (1-3): Doesn't interfere with normal activities, abdomen soft and not tender to touch.     - MODERATE (4-7): Interferes with normal activities or awakens from sleep, abdomen tender to touch.     - SEVERE (8-10): Excruciating pain, doubled over, unable to do any normal  activities.       No pain today; 8 at the worst 7. RECURRENT SYMPTOM: "Have you ever had this type of stomach pain before?" If Yes, ask: "When was the last time?" and "What happened that time?"      First time 8. CAUSE: "What do you think is causing the stomach pain?"     N/A 9. RELIEVING/AGGRAVATING FACTORS: "What makes it better or worse?" (e.g., antacids, bending or twisting motion, bowel movement)     N/A 10. OTHER SYMPTOMS: "Do you have any other symptoms?" (e.g., back pain, diarrhea, fever, urination pain, vomiting)       A little pain with urination, blood on tissue when wipes after urination 11. PREGNANCY: "Is there any chance you are pregnant?" "When was your last menstrual period?"       No  Protocols used: Abdominal Pain - [facility]

## 2021-10-03 ENCOUNTER — Other Ambulatory Visit: Payer: Self-pay

## 2021-10-03 DIAGNOSIS — Z87898 Personal history of other specified conditions: Secondary | ICD-10-CM

## 2021-12-20 ENCOUNTER — Ambulatory Visit
Admission: RE | Admit: 2021-12-20 | Discharge: 2021-12-20 | Disposition: A | Discharge status: Home / Self Care | Payer: Self-pay | Source: Ambulatory Visit | Attending: Obstetrics and Gynecology | Admitting: Obstetrics and Gynecology

## 2021-12-20 ENCOUNTER — Ambulatory Visit: Payer: Self-pay | Admitting: Hematology and Oncology

## 2021-12-20 ENCOUNTER — Ambulatory Visit
Admission: RE | Admit: 2021-12-20 | Discharge: 2021-12-20 | Disposition: A | Discharge status: Home / Self Care | Payer: No Typology Code available for payment source | Source: Ambulatory Visit | Attending: Obstetrics and Gynecology | Admitting: Obstetrics and Gynecology

## 2021-12-20 ENCOUNTER — Other Ambulatory Visit: Payer: Self-pay | Admitting: Obstetrics and Gynecology

## 2021-12-20 VITALS — BP 134/98 | Wt 185.2 lb

## 2021-12-20 DIAGNOSIS — Z87898 Personal history of other specified conditions: Secondary | ICD-10-CM

## 2021-12-20 NOTE — Progress Notes (Signed)
[name] is a 46 y.o. female who presents to [facility] clinic today with no complaints . History of most likely benign mass in the left breast.    Pap Smear: Pap not smear completed today. Last Pap smear was 2021 at [facility] and was normal. Per patient has no history of an abnormal Pap smear. Last Pap smear result is not available in Epic.   Physical exam: Breasts Breasts symmetrical. No skin abnormalities bilateral breasts. No nipple retraction bilateral breasts. No nipple discharge bilateral breasts. No lymphadenopathy. No lumps palpated bilateral breasts.     MM DIAG BREAST TOMO BILATERAL  Result Date: 12/14/2020 CLINICAL DATA:  One year interval follow-up of a likely benign mass in the [place] of the LEFT breast at the 11 o'clock position 6 cm from nipple, possibly clustered cysts or intramammary lymph node, originally identified on baseline mammography. Annual evaluation, RIGHT breast. EXAM: DIGITAL DIAGNOSTIC BILATERAL MAMMOGRAM WITH TOMOSYNTHESIS AND CAD; ULTRASOUND LEFT BREAST LIMITED TECHNIQUE: Bilateral digital diagnostic mammography and breast tomosynthesis was performed. The images were evaluated with computer-aided detection.; Targeted ultrasound examination of the left breast was performed. COMPARISON:  Previous exam(s). ACR Breast Density Category c: The breast tissue is heterogeneously dense, which may obscure small masses. FINDINGS: Full field CC and MLO views of both breasts were obtained. RIGHT: No findings suspicious for malignancy. LEFT: The circumscribed isodense mass with possible central fat in the UPPER INNER QUADRANT at posterior depth is unchanged, measuring approximately 7 mm. There is no associated architectural distortion or suspicious calcifications. The previously identified benign cyst in the upper inner subareolar location has increased slightly in size since the mammogram 6 months ago, currently measuring on the order of 3 cm. No new or suspicious  findings elsewhere. Targeted ultrasound is performed, again demonstrating the circumscribed oval parallel heterogeneous though predominantly hypoechoic mass at the 11 o'clock position 6 cm from the nipple measuring approximately 7 x 2 x 7 mm (previously 6 x 2 x 6 mm on 06/07/2020 and 6 x 4 x 6 mm on 12/07/2019), demonstrating slight posterior acoustic enhancement and no internal power Doppler flow. IMPRESSION: 1. Stable likely benign 7 mm mass in the UPPER INNER QUADRANT of the LEFT breast at posterior depth at 11 o'clock 6 cm from the nipple. 2. No mammographic evidence of malignancy involving the RIGHT breast. RECOMMENDATION: LEFT breast ultrasound at time of annual BILATERAL diagnostic mammography in 1 year (in order to confirm 2 years of stability of the likely benign mass). I have discussed the findings and recommendations with the patient. If applicable, a reminder letter will be sent to the patient regarding the next appointment. BI-RADS CATEGORY  3: Probably benign. Electronically Signed   By: Evangeline Dakin M.D.   On: 12/14/2020 14:38  MM DIAG BREAST TOMO UNI LEFT  Result Date: 06/07/2020 CLINICAL DATA:  Short-term follow-up for a probably benign mass in the left breast, initially assessed on 12/07/2019. EXAM: DIGITAL DIAGNOSTIC UNILATERAL LEFT MAMMOGRAM WITH TOMOSYNTHESIS AND CAD; ULTRASOUND LEFT BREAST LIMITED TECHNIQUE: Left digital diagnostic mammography and breast tomosynthesis was performed. The images were evaluated with computer-aided detection.; Targeted ultrasound examination of the left breast was performed COMPARISON:  11/18/2019 and 12/07/2019. ACR Breast Density Category c: The breast tissue is heterogeneously dense, which may obscure small masses. FINDINGS: The small mass in the posterior slightly upper inner left breast, is unchanged. The larger circumscribed mass in the retroareolar left breast, shown to be a benign cyst, is also stable. There are no new  masses, no areas of  architectural distortion and no suspicious calcifications. Targeted ultrasound is performed, showing a small cystic lesion in the left breast at 11 o'clock, 6 cm the nipple, posterior depth, measuring 6 x 2 x 6 mm, without significant change from the prior exam. IMPRESSION: 1. Probably benign small mass, most likely complicated cyst/apocrine cyst, in the left breast at 11 o'clock, 6 cm the nipple, stable for 6 months. Additional short-term follow-up recommended. RECOMMENDATION: Diagnostic mammography and left breast ultrasound in 6 months. I have discussed the findings and recommendations with the patient. If applicable, a reminder letter will be sent to the patient regarding the next appointment. BI-RADS CATEGORY  3: Probably benign. Electronically Signed   By: Lajean Manes M.D.   On: 06/07/2020 16:21   MS DIGITAL DIAG TOMO UNI LEFT  Result Date: 12/07/2019 CLINICAL DATA:  The patient was called back for left breast mass. EXAM: DIGITAL DIAGNOSTIC LEFT MAMMOGRAM WITH TOMO ULTRASOUND LEFT BREAST COMPARISON:  Previous exam(s). ACR Breast Density Category c: The breast tissue is heterogeneously dense, which may obscure small masses. FINDINGS: The mass in the slightly medial superior left breast persists on additional imaging. There is an oval mass in the anteromedial left breast. No other suspicious findings. On physical exam, no suspicious lumps are identified. Targeted ultrasound is performed, showing a simple cyst in the left breast at 11:30, 3 cm from the nipple. Another small mildly complicated cyst is seen at 12 o'clock, 7 cm from the nipple measuring 4 mm. A probably benign mass at 11 o'clock, 6 cm from the nipple is thought to correlate with the mass for which the patient was called back, measuring 6 by 6 x 4 mm. On real-time imaging, this appears to represent a cluster of cysts. IMPRESSION: Fibrocystic changes. 6 x 6 x 4 mm probably benign mass in the left breast at 11 o'clock, 6 cm from the nipple,  thought to correlate with the mammographic finding, probably a cluster of cysts. RECOMMENDATION: Recommend six-month follow-up ultrasound and mammogram of the probably benign left breast mass at 12 o'clock, 7 cm from the nipple. I have discussed the findings and recommendations with the patient. If applicable, a reminder letter will be sent to the patient regarding the next appointment. BI-RADS CATEGORY  3: Probably benign. Electronically Signed   By: Dorise Bullion III M.D   On: 12/07/2019 16:34   MS DIGITAL SCREENING TOMO BILATERAL  Result Date: 11/22/2019 CLINICAL DATA:  Screening. EXAM: DIGITAL SCREENING BILATERAL MAMMOGRAM WITH TOMO AND CAD COMPARISON:  None. ACR Breast Density Category c: The breast tissue is heterogeneously dense, which may obscure small masses. FINDINGS: In the left breast, a possible mass warrants further evaluation. In the right breast, no findings suspicious for malignancy. Images were processed with CAD. IMPRESSION: Further evaluation is suggested for a possible mass in the left breast. RECOMMENDATION: Diagnostic mammogram and possibly ultrasound of the left breast. (Code:[id number]-L-39M) The patient will be contacted regarding the findings, and additional imaging will be scheduled. BI-RADS CATEGORY  0: Incomplete. Need additional imaging evaluation and/or prior mammograms for comparison. Electronically Signed   By: Audie Pinto M.D.   On: 11/22/2019 12:52      Pelvic/Bimanual Pap is not indicated today    Smoking History: Patient has never smoked and was not referred to quit line.    Patient Navigation: Patient education provided. Access to services provided for patient through [place] interpreter provided. No transportation provided   Colorectal Cancer Screening: Per patient  has never had colonoscopy completed No complaints today.    Breast and Cervical Cancer Risk Assessment: Patient does not have family history of breast cancer, known  genetic mutations, or radiation treatment to the chest before age 53. Patient does not have history of cervical dysplasia, immunocompromised, or DES exposure in-utero.  Risk Assessment   No risk assessment data for the current encounter  Risk Scores       11/30/2020   Last edited by: Royston Bake, CMA   5-year risk: 0.4 %   Lifetime risk: 5.5 %            A: [facility] exam without pap smear No complaints with benign exam. History of 11mm most likely benign mass in the left breast being followed with mammogram.  P: Referred patient to the [facility] for a diagnostic mammogram. Appointment scheduled 12/20/2021.  Melodye Ped, NP 12/20/2021 12:49 PM

## 2021-12-20 NOTE — Patient Instructions (Signed)
Taught Carney Harder about breast self awareness. Patient did not need a Pap smear today due to last Pap smear was in 2021 per patient.  Let her know [facility] will cover Pap smears every 5 years unless has a history of abnormal Pap smears. Referred patient to the [facility] for diagnostic mammogram. Appointment scheduled for 12/20/2021. Patient aware of appointment and will be there. Let patient know will follow up with her within the next couple weeks with results. Trenesha Hitsman verbalized understanding. She will return to clinic in one year for repeat mammogram and repeat Pap smear in 2026.  Melodye Ped, NP 12:52 PM

## 2022-10-02 ENCOUNTER — Ambulatory Visit: Payer: Self-pay | Attending: Physician Assistant | Admitting: Physician Assistant

## 2022-10-02 ENCOUNTER — Encounter: Payer: Self-pay | Admitting: Physician Assistant

## 2022-10-02 VITALS — BP 132/90 | HR 89 | Wt 194.2 lb

## 2022-10-02 DIAGNOSIS — L570 Actinic keratosis: Secondary | ICD-10-CM

## 2022-10-02 DIAGNOSIS — Z603 Acculturation difficulty: Secondary | ICD-10-CM

## 2022-10-02 DIAGNOSIS — Z758 Other problems related to medical facilities and other health care: Secondary | ICD-10-CM

## 2022-10-02 NOTE — Progress Notes (Signed)
Patient ID: Carmen Pierce, female   DOB: 01-29-1976, 47 y.o.   MRN: [id number]   Carmen Pierce, is a 47 y.o. female  BMW:[id number]  [id number]  DOB - October 08, 1975  No chief complaint on file.      Subjective:   Carmen Pierce is a 47 y.o. female here today for a lesion on her face that started out about 4 years ago as a black head that she popped with her fingers.  Since that time she has developed a mole/lesion on the R cheek where the blackhead was.  She has been using wart remover on it for the last week.    No problems updated.  ALLERGIES: No Known Allergies  PAST MEDICAL HISTORY: Past Medical History:  Diagnosis Date   GERD (gastroesophageal reflux disease)    Insomnia     MEDICATIONS AT HOME: Prior to Admission medications   Medication Sig Start Date End Date Taking? Authorizing Provider  gabapentin (NEURONTIN) 300 MG capsule Take 1 capsule (300 mg total) by mouth 3 (three) times daily. Patient not taking: Reported on 11/30/2020 11/10/20   Claiborne Rigg, NP  omeprazole (PRILOSEC) 20 MG capsule Take 1 capsule (20 mg total) by mouth daily. Patient not taking: Reported on 11/30/2020 11/10/20 02/08/21  Claiborne Rigg, NP  traZODone (DESYREL) 50 MG tablet Take 1 tablet (50 mg total) by mouth at bedtime as needed for sleep. 11/15/19 12/15/19  Claiborne Rigg, NP  Vitamin D, Ergocalciferol, (DRISDOL) 1.25 MG (50000 UNIT) CAPS capsule Take 1 capsule (50,000 Units total) by mouth every 7 (seven) days. Patient not taking: Reported on 11/30/2020 11/12/20   Claiborne Rigg, NP    ROS: Neg HEENT Neg resp Neg cardiac Neg GI Neg GU Neg MS Neg psych Neg neuro  Objective:   Vitals:   10/02/22 1554  BP: (!) 132/90  Pulse: 89  SpO2: 93%  Weight: 194 lb 3.2 oz (88.1 kg)   Exam General appearance : Awake, alert, not in any distress. Speech Clear. Not toxic looking HEENT: Atraumatic and Normocephalic Neck: Supple, no JVD. No cervical lymphadenopathy.  Chest:  Good air entry bilaterally, CTAB.  No rales/rhonchi/wheezing CVS: S1 S2 regular, no murmurs.  Extremities: B/L Lower Ext shows no edema, both legs are warm to touch Neurology: Awake alert, and oriented X 3, CN II-XII intact, Non focal Skin: R cheek-1cm pedunculated and hyperpigmented lesion with texture and appears aggravated.  Regular borders  Data Review Lab Results  Component Value Date   HGBA1C 5.3 11/10/2020   HGBA1C 5.1 10/07/2019    Assessment & Plan   1. Actinic keratosis of right cheek?  Or SK - Ambulatory referral to Dermatology Wear sunscreen.  Stop wart remover Apply financial aid and medicaid-instructions provided  2. Language barrier [employer] "[employer]" interpreters used and additional time performing visit was required.     Return if symptoms worsen or fail to improve.  The patient was given clear instructions to go to ER or return to medical center if symptoms don't improve, worsen or new problems develop. The patient verbalized understanding. The patient was told to call to get lab results if they haven't heard anything in the next week.      Georgian Co, PA-C [facility] and [facility] [place], [place] [phone]   10/02/2022, 4:44 PM

## 2022-10-02 NOTE — Patient Instructions (Signed)
Queratosis actnica Actinic Keratosis Una queratosis actnica es un crecimiento precanceroso en la piel. Si hay ms de un crecimiento, se trata de queratosis actnicas. Estos crecimientos aparecen con mayor frecuencia en partes de la piel que reciben mucha exposicin al sol, por ejemplo: El cuero cabelludo. La cara. Los odos. Los labios. La espalda. Los antebrazos. Los dorsos de [employer]. Si no se tratan, estos crecimientos pueden transformarse en un cncer de piel denominado carcinoma de clulas escamosas. Es importante que un mdico evale todos esos crecimientos para Futures trader. Cules son las causas? Las queratosis actnicas se producen por recibir demasiada radiacin de rayos ultravioletas (UV) que provienen del sol o de otras fuentes de [place]. Qu incrementa el riesgo? Es ms probable que desarrolle esta afeccin si: Tiene la piel clara u ojos azules. Tiene el cabello rubio o pelirrojo. Pasa mucho tiempo al sol. No se protege la piel del sol cuando est al [employer]. Es Neomia Dear persona de edad avanzada. El riesgo de desarrollar una queratosis actnica aumenta con la edad. Cules son los signos o sntomas? Estos crecimientos se sienten como manchas escamosas y rugosas de la piel. Los sntomas de esta afeccin incluyen crecimientos que pueden: Ser tan pequeos como la cabeza de un alfiler o tan grandes como [place]. Picar, doler o tener sensibilidad. Pueden ser del color de la piel, tostado claro, tostado oscuro, rosa o una combinacin de estos colores. En la [employer], los crecimientos se tornan de color rojo. Tener una pequea parte de piel rosa o gris (papiloma cutneo) que crece desde ellos. Puede ser ms fcil notar los crecimientos al palparlos que al verlos. A veces, las queratosis actnicas desaparecen, pero pueden reaparecer unos das o hasta algunas semanas ms tarde. Cmo se diagnostica? Por lo general, esta afeccin se diagnostica  mediante un examen fsico. Se puede extraer Lauris Poag de tejido del crecimiento y examinarla con un microscopio (biopsia). Cmo se trata? El tratamiento para esta afeccin puede incluir lo siguiente: Raspado de la queratosis actnica (curetaje). Congelacin de la queratosis actnica con nitrgeno lquido (criociruga). Esto hace que el crecimiento caiga luego de un [place]. Aplicacin de medicamentos en forma de crema o gel para destruir las clulas en el crecimiento. Aplicacin de productos qumicos al crecimiento para que las capas externas de piel se desprendan (exfoliacin qumica). Uso de terapia fotodinmica. En este procedimiento, la crema medicinal se aplica a la queratosis actnica. Esta crema aumenta la sensibilidad de la piel a la luz. Luego, se apunta una luz fuerte a la queratosis actnica para destruir las Transport planner. Siga estas indicaciones en su casa: Cuidado de la piel Aplique paos fros y hmedos (compresasfrescas) en las zonas afectadas. No se rasque la piel. Revise su piel regularmente para detectar crecimientos, especialmente con las siguientes caractersticas: Pican o sangran. Cambian de tamao, forma o color. Cuidado de la zona tratada Mantenga la zona limpia y seca, como se lo haya indicado el mdico. No aplique medicamentos, cremas o lociones en la zona tratada, a menos que el mdico se lo indique. No explote las ampollas ni trate de romperlas abrindolas. Esto puede ocasionar una infeccin y dejar cicatrices. Si la piel se pone roja o se irrita despus del tratamiento, siga las instrucciones del mdico acerca de cmo cuidar la zona tratada. Asegrese de hacer lo siguiente: Lvese las manos con agua y jabn durante al menos 20 segundos antes y despus de cambiarse la venda (vendaje). Use desinfectante para manos si no  dispone de [place] y [place]. Cambie el vendaje como se lo haya indicado el mdico. Si la piel se pone roja o se irrita despus del  tratamiento, revise la zona tratada todos los das para comprobar si hay signos de infeccin. Est atento a los siguientes signos: Enrojecimiento, Optician, dispensing. Lquido o sangre. Calor. Pus o mal olor. Estilo de vida No consuma ningn producto que contenga nicotina o tabaco. Estos productos incluyen cigarrillos, tabaco para Theatre manager y aparatos de vapeo, como los Administrator, Civil Service. Si necesita ayuda para dejar de consumir estos productos, consulte al [employer]. Tome medidas para protegerse la piel del sol, por ejemplo: Evite el sol entre las 10:00 y las 16:00. En este horario, la luz ultravioleta (UV) es ms fuerte. Use una pantalla o un protector solar con factor de proteccin solar (FPS) de 30 o mayor. Aplquese pantalla solar antes de exponerse a la luz solar y vuelva a aplicrsela con tanta frecuencia como se indica en las instrucciones del envase de la pantalla solar. Use equipo de proteccin, que incluye: Gafas de sol con proteccin UV. Un sombrero y ropa que protejan la piel de la luz solar. Cuando sea posible, evite los medicamentos que aumenten la sensibilidad a la luz del sol. Nouse camas solares u otros dispositivos de bronceado en interiores. Indicaciones generales Tome o aplquese los medicamentos de venta libre y los recetados solamente como se lo haya indicado el mdico. Retome sus actividades normales segn lo indicado por el mdico. Pregntele al mdico qu actividades son seguras para usted. Somtase a Nurse, learning disability de piel todos los aos con un mdico especialista en piel (dermatlogo). Concurra a todas las visitas de seguimiento. El mdico querr verificar que el lugar haya cicatrizado despus del [place]. Comunquese con un mdico si: Nota cualquier cambio o crecimientos nuevos en la piel. Tiene hinchazn, dolor o enrojecimiento alrededor de la zona tratada. Tiene secrecin de lquido o sangre que emana de la zona tratada. La zona tratada est caliente al  tacto. Tiene pus o percibe que sale mal olor de la zona tratada. Tiene fiebre o escalofros. Tiene una ampolla cuyo tamao aumenta y Hydrographic surveyor. Resumen Una queratosis actnica es un crecimiento precanceroso en la piel.Si no se tratan, estos crecimientos pueden transformarse en cncer de piel. Revise su piel regularmente para detectar crecimientos, especialmente crecimientos que comiencen a picar o sangrar, o que cambien de tamao, forma o color. Tome medidas para protegerse la piel del sol. Comunquese con un mdico si nota cualquier cambio o crecimientos nuevos en la piel. Esta informacin no tiene Theme park manager el consejo del mdico. Asegrese de hacerle al mdico cualquier pregunta que tenga. Document Revised: 05/31/2021 Document Reviewed: 05/31/2021 Elsevier Patient Education  2024 [employer].

## 2022-11-26 ENCOUNTER — Telehealth: Payer: Self-pay

## 2022-11-26 ENCOUNTER — Other Ambulatory Visit: Payer: Self-pay

## 2022-11-26 DIAGNOSIS — N644 Mastodynia: Secondary | ICD-10-CM

## 2022-11-26 NOTE — Telephone Encounter (Signed)
Telephoned patient at mobile number using interpreter, Carmen Pierce. Left voice message with [facility] contact information.

## 2023-01-30 ENCOUNTER — Ambulatory Visit
Admission: RE | Admit: 2023-01-30 | Discharge: 2023-01-30 | Disposition: A | Discharge status: Home / Self Care | Payer: No Typology Code available for payment source | Source: Ambulatory Visit | Attending: Obstetrics and Gynecology | Admitting: Obstetrics and Gynecology

## 2023-01-30 ENCOUNTER — Ambulatory Visit: Payer: Self-pay | Admitting: Hematology and Oncology

## 2023-01-30 VITALS — BP 134/74 | Wt 186.0 lb

## 2023-01-30 DIAGNOSIS — Z1211 Encounter for screening for malignant neoplasm of colon: Secondary | ICD-10-CM

## 2023-01-30 DIAGNOSIS — N644 Mastodynia: Secondary | ICD-10-CM

## 2023-01-30 NOTE — Progress Notes (Signed)
Carmen Pierce is a 47 y.o. female who presents to [facility] clinic today with complaint of left breast mass.    Pap Smear: Pap not smear completed today. Last Pap smear was 11/15/2019 and was normal. Per patient has no history of an abnormal Pap smear. Last Pap smear result is available in Epic.   Physical exam: Breasts Breasts symmetrical. No skin abnormalities bilateral breasts. No nipple retraction bilateral breasts. No nipple discharge bilateral breasts. No lymphadenopathy. No lumps palpated right breast. Left breast with 3 cm elongated mass noted at 11 o'clock.  MS DIGITAL DIAG TOMO BILAT Result Date: 12/20/2021 CLINICAL DATA:  Two year follow-up of a probable cluster of cysts in left breast at o'clock. EXAM: DIGITAL DIAGNOSTIC BILATERAL MAMMOGRAM WITH TOMOSYNTHESIS; ULTRASOUND LEFT BREAST LIMITED; ULTRASOUND RIGHT BREAST LIMITED TECHNIQUE: Bilateral digital diagnostic mammography and breast tomosynthesis was performed.; Targeted ultrasound examination of the left breast was performed.; Targeted ultrasound examination of the right breast was performed COMPARISON:  Previous exam(s). ACR Breast Density Category c: The breast tissue is heterogeneously dense, which may obscure small masses. FINDINGS: New masses are identified in the lateral right breast. No new masses identified in lateral left breast. The previously identified mass, thought to be a cluster of cyst based on ultrasound, significantly smaller compared to previous studies. A previously identified subareolar cyst continues to grow. No new or suspicious findings otherwise seen in either breast. On physical exam, no suspicious lumps are identified. Targeted ultrasound is performed, showing fibrocystic changes throughout both breast accounting for mammographic findings. The previously identified cluster of cysts is no longer visualized. However, mammographically, the mass has significantly decreased in size since first identified in 2021,  consistent with a benign etiology. IMPRESSION: Fibrocystic changes.  No evidence of malignancy. RECOMMENDATION: Annual screening mammography. I have discussed the findings and recommendations with the patient. If applicable, a reminder letter will be sent to the patient regarding the next appointment. BI-RADS CATEGORY  2: Benign. Electronically Signed   By: Gerome Sam III M.D.   On: 12/20/2021 15:26  MM DIAG BREAST TOMO BILATERAL Result Date: 12/14/2020 CLINICAL DATA:  One year interval follow-up of a likely benign mass in the UPPER INNER QUADRANT of the LEFT breast at the 11 o'clock position 6 cm from nipple, possibly clustered cysts or intramammary lymph node, originally identified on baseline mammography. Annual evaluation, RIGHT breast. EXAM: DIGITAL DIAGNOSTIC BILATERAL MAMMOGRAM WITH TOMOSYNTHESIS AND CAD; ULTRASOUND LEFT BREAST LIMITED TECHNIQUE: Bilateral digital diagnostic mammography and breast tomosynthesis was performed. The images were evaluated with computer-aided detection.; Targeted ultrasound examination of the left breast was performed. COMPARISON:  Previous exam(s). ACR Breast Density Category c: The breast tissue is heterogeneously dense, which may obscure small masses. FINDINGS: Full field CC and MLO views of both breasts were obtained. RIGHT: No findings suspicious for malignancy. LEFT: The circumscribed isodense mass with possible central fat in the UPPER INNER QUADRANT at posterior depth is unchanged, measuring approximately 7 mm. There is no associated architectural distortion or suspicious calcifications. The previously identified benign cyst in the upper inner subareolar location has increased slightly in size since the mammogram 6 months ago, currently measuring on the order of 3 cm. No new or suspicious findings elsewhere. Targeted ultrasound is performed, again demonstrating the circumscribed oval parallel heterogeneous though predominantly hypoechoic mass at the 11 o'clock  position 6 cm from the nipple measuring approximately 7 x 2 x 7 mm (previously 6 x 2 x 6 mm on 06/07/2020 and 6 x 4 x 6 mm  on 12/07/2019), demonstrating slight posterior acoustic enhancement and no internal power Doppler flow. IMPRESSION: 1. Stable likely benign 7 mm mass in the UPPER INNER QUADRANT of the LEFT breast at posterior depth at 11 o'clock 6 cm from the nipple. 2. No mammographic evidence of malignancy involving the RIGHT breast. RECOMMENDATION: LEFT breast ultrasound at time of annual BILATERAL diagnostic mammography in 1 year (in order to confirm 2 years of stability of the likely benign mass). I have discussed the findings and recommendations with the patient. If applicable, a reminder letter will be sent to the patient regarding the next appointment. BI-RADS CATEGORY  3: Probably benign. Electronically Signed   By: Hulan Saas M.D.   On: 12/14/2020 14:38  MM DIAG BREAST TOMO UNI LEFT Result Date: 06/07/2020 CLINICAL DATA:  Short-term follow-up for a probably benign mass in the left breast, initially assessed on 12/07/2019. EXAM: DIGITAL DIAGNOSTIC UNILATERAL LEFT MAMMOGRAM WITH TOMOSYNTHESIS AND CAD; ULTRASOUND LEFT BREAST LIMITED TECHNIQUE: Left digital diagnostic mammography and breast tomosynthesis was performed. The images were evaluated with computer-aided detection.; Targeted ultrasound examination of the left breast was performed COMPARISON:  11/18/2019 and 12/07/2019. ACR Breast Density Category c: The breast tissue is heterogeneously dense, which may obscure small masses. FINDINGS: The small mass in the posterior slightly upper inner left breast, is unchanged. The larger circumscribed mass in the retroareolar left breast, shown to be a benign cyst, is also stable. There are no new masses, no areas of architectural distortion and no suspicious calcifications. Targeted ultrasound is performed, showing a small cystic lesion in the left breast at 11 o'clock, 6 cm the nipple, posterior  depth, measuring 6 x 2 x 6 mm, without significant change from the prior exam. IMPRESSION: 1. Probably benign small mass, most likely complicated cyst/apocrine cyst, in the left breast at 11 o'clock, 6 cm the nipple, stable for 6 months. Additional short-term follow-up recommended. RECOMMENDATION: Diagnostic mammography and left breast ultrasound in 6 months. I have discussed the findings and recommendations with the patient. If applicable, a reminder letter will be sent to the patient regarding the next appointment. BI-RADS CATEGORY  3: Probably benign. Electronically Signed   By: Amie Portland M.D.   On: 06/07/2020 16:21   MS DIGITAL DIAG TOMO UNI LEFT Result Date: 12/07/2019 CLINICAL DATA:  The patient was called back for left breast mass. EXAM: DIGITAL DIAGNOSTIC LEFT MAMMOGRAM WITH TOMO ULTRASOUND LEFT BREAST COMPARISON:  Previous exam(s). ACR Breast Density Category c: The breast tissue is heterogeneously dense, which may obscure small masses. FINDINGS: The mass in the slightly medial superior left breast persists on additional imaging. There is an oval mass in the anteromedial left breast. No other suspicious findings. On physical exam, no suspicious lumps are identified. Targeted ultrasound is performed, showing a simple cyst in the left breast at 11:30, 3 cm from the nipple. Another small mildly complicated cyst is seen at 12 o'clock, 7 cm from the nipple measuring 4 mm. A probably benign mass at 11 o'clock, 6 cm from the nipple is thought to correlate with the mass for which the patient was called back, measuring 6 by 6 x 4 mm. On real-time imaging, this appears to represent a cluster of cysts. IMPRESSION: Fibrocystic changes. 6 x 6 x 4 mm probably benign mass in the left breast at 11 o'clock, 6 cm from the nipple, thought to correlate with the mammographic finding, probably a cluster of cysts. RECOMMENDATION: Recommend six-month follow-up ultrasound and mammogram of the probably benign left breast  mass at 12 o'clock, 7 cm from the nipple. I have discussed the findings and recommendations with the patient. If applicable, a reminder letter will be sent to the patient regarding the next appointment. BI-RADS CATEGORY  3: Probably benign. Electronically Signed   By: Gerome Sam III M.D   On: 12/07/2019 16:34   MS DIGITAL SCREENING TOMO BILATERAL Result Date: 11/22/2019 CLINICAL DATA:  Screening. EXAM: DIGITAL SCREENING BILATERAL MAMMOGRAM WITH TOMO AND CAD COMPARISON:  None. ACR Breast Density Category c: The breast tissue is heterogeneously dense, which may obscure small masses. FINDINGS: In the left breast, a possible mass warrants further evaluation. In the right breast, no findings suspicious for malignancy. Images were processed with CAD. IMPRESSION: Further evaluation is suggested for a possible mass in the left breast. RECOMMENDATION: Diagnostic mammogram and possibly ultrasound of the left breast. (Code:[id number]-45M) The patient will be contacted regarding the findings, and additional imaging will be scheduled. BI-RADS CATEGORY  0: Incomplete. Need additional imaging evaluation and/or prior mammograms for comparison. Electronically Signed   By: Emmaline Kluver M.D.   On: 11/22/2019 12:52         Pelvic/Bimanual Pap is not indicated today    Smoking History: Patient has never smoked and was not referred to quit line.    Patient Navigation: Patient education provided. Access to services provided for patient through [facility] program. Natale Lay interpreter provided. No transportation provided   Colorectal Cancer Screening: Per patient has never had colonoscopy completed No complaints today. FIT test given.    Breast and Cervical Cancer Risk Assessment: Patient does not have family history of breast cancer, known genetic mutations, or radiation treatment to the chest before age 52. Patient does not have history of cervical dysplasia, immunocompromised, or DES exposure in-utero.  Risk  Scores as of Encounter on 01/30/2023     Dondra Spry           5-year 0.44%   Lifetime 5.68%            Last calculated by Caprice Red, CMA on 01/30/2023 at  2:32 PM        A: [facility] exam without pap smear Complaints of left breast mass as noted on exam above.   P: Referred patient to the [facility] for a diagnostic mammogram. Appointment scheduled 01/30/2023.  Pascal Lux, NP 01/30/2023 2:43 PM

## 2023-01-30 NOTE — Patient Instructions (Signed)
Taught Carmen Pierce about self breast awareness and gave educational materials to take home. Patient did not need a Pap smear today due to last Pap smear was in 11/15/2019 per patient.  Let her know [facility] will cover Pap smears every 5 years unless has a history of abnormal Pap smears. Referred patient to the [facility] for diagnostic mammogram. Appointment scheduled for 01/30/2020. Patient aware of appointment and will be there. Let patient know will follow up with her within the next couple weeks with results. Carmen Pierce verbalized understanding.  Pascal Lux, NP 2:46 PM

## 2023-07-03 IMAGING — US US BREAST*L* LIMITED INC AXILLA
1 series · 7 of 7 positions shown · non-contrast
Comparison: Previous exam(s).

CLINICAL DATA: One year interval follow-up of a likely benign mass
in the UPPER INNER QUADRANT of the LEFT breast at the 11 o'clock
position 6 cm from nipple, possibly clustered cysts or intramammary
lymph node, originally identified on baseline mammography. Annual
evaluation, RIGHT breast.

EXAM:
DIGITAL DIAGNOSTIC BILATERAL MAMMOGRAM WITH TOMOSYNTHESIS AND CAD;
ULTRASOUND LEFT BREAST LIMITED
TECHNIQUE: Bilateral digital diagnostic mammography and breast tomosynthesis
was performed. The images were evaluated with computer-aided
detection.; Targeted ultrasound examination of the left breast was
performed.

[Series 1: us breast*left* limited inc axilla · 0.06mm/px · 7 of 7 slices shown]
[im 1/7]
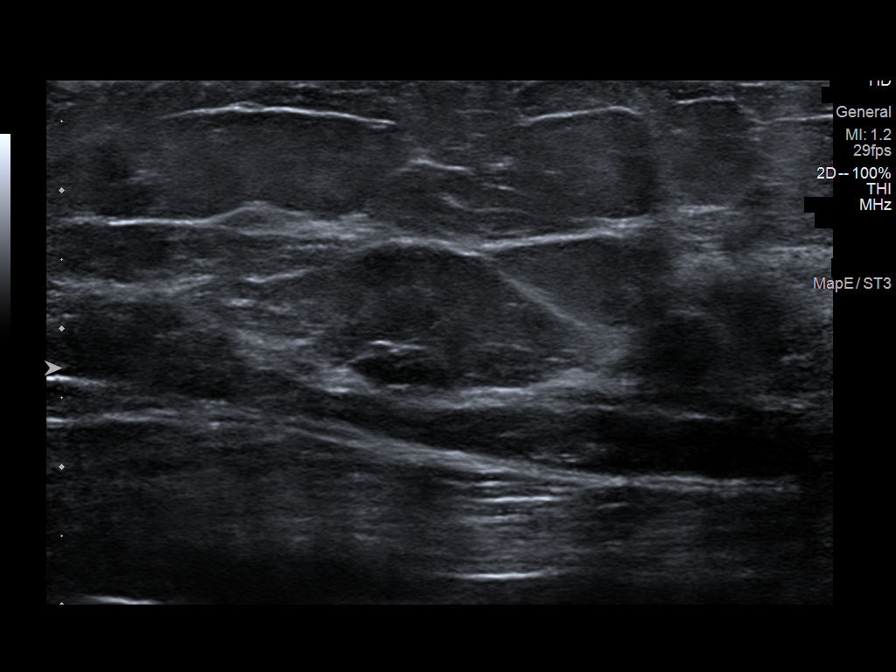
[im 2/7]
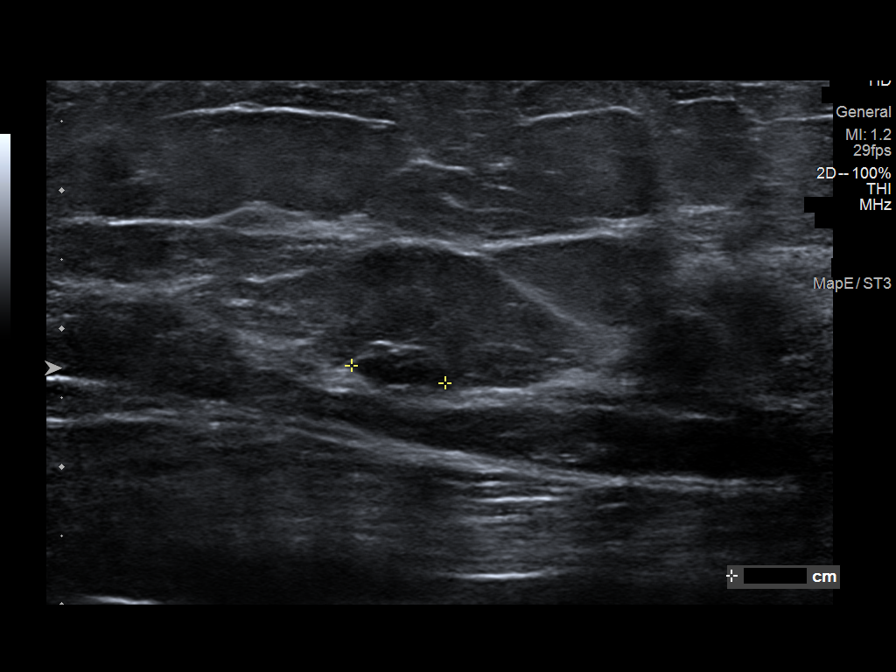
[im 3/7]
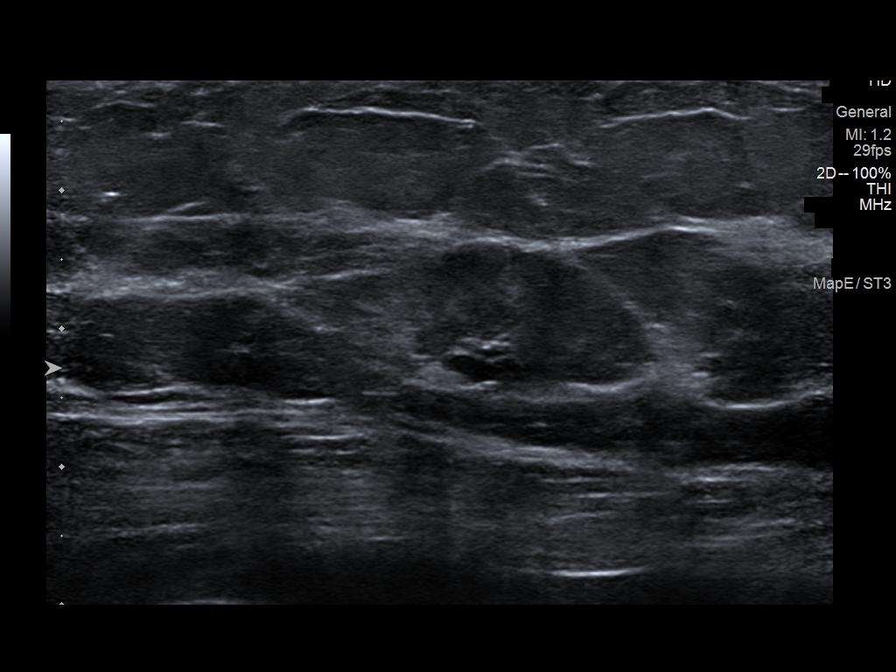
[im 4/7]
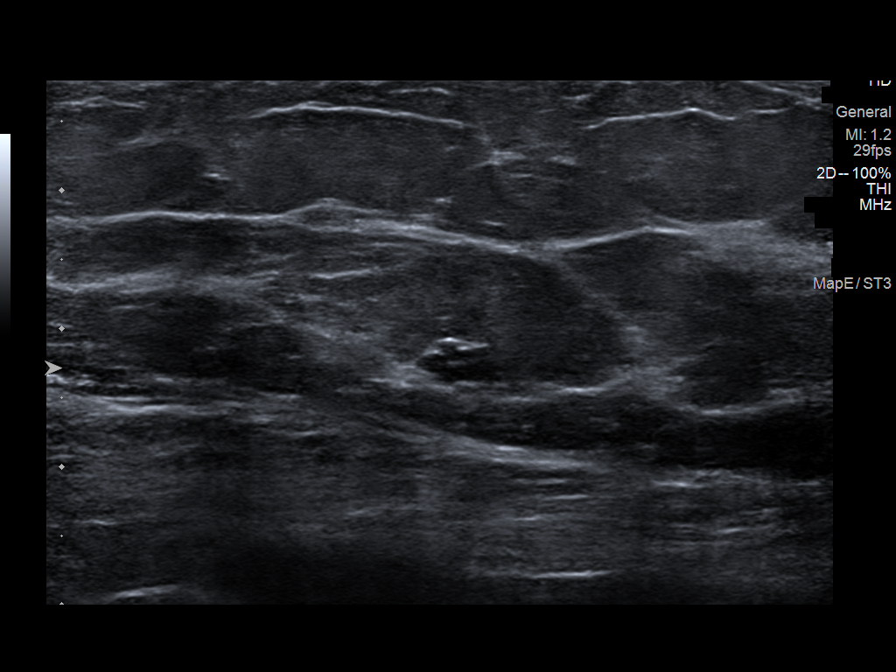
[im 5/7]
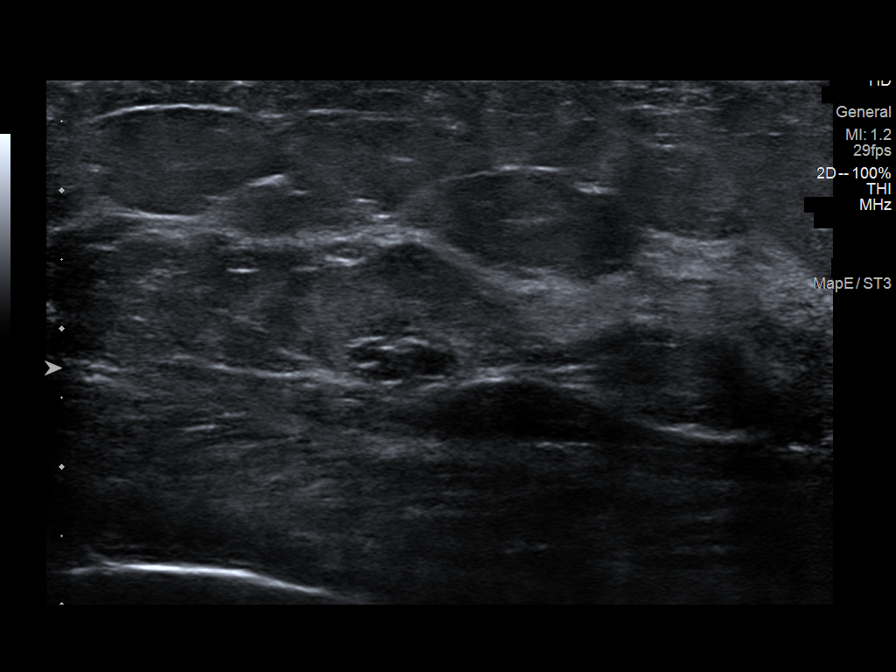
[im 6/7]
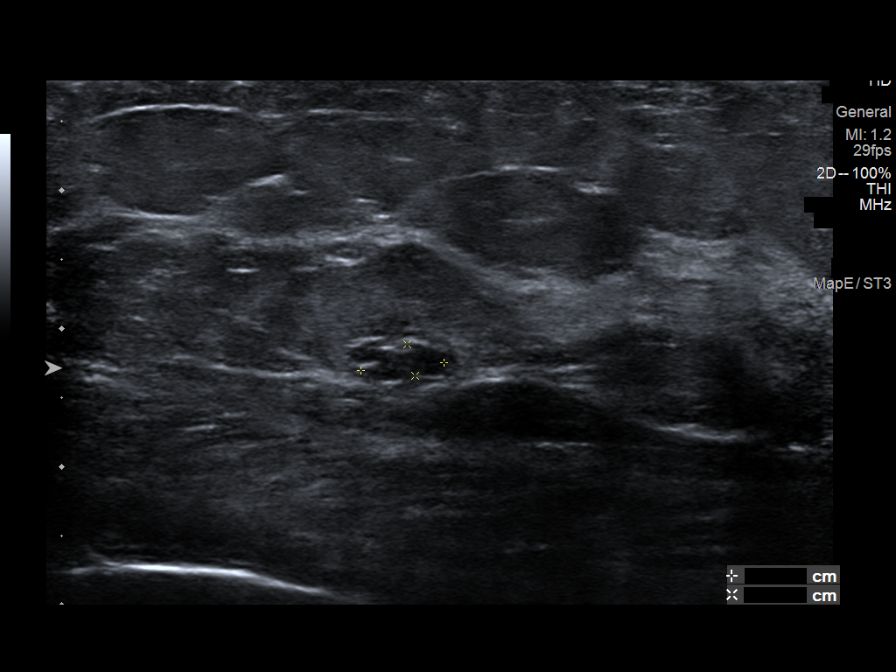
[im 7/7]
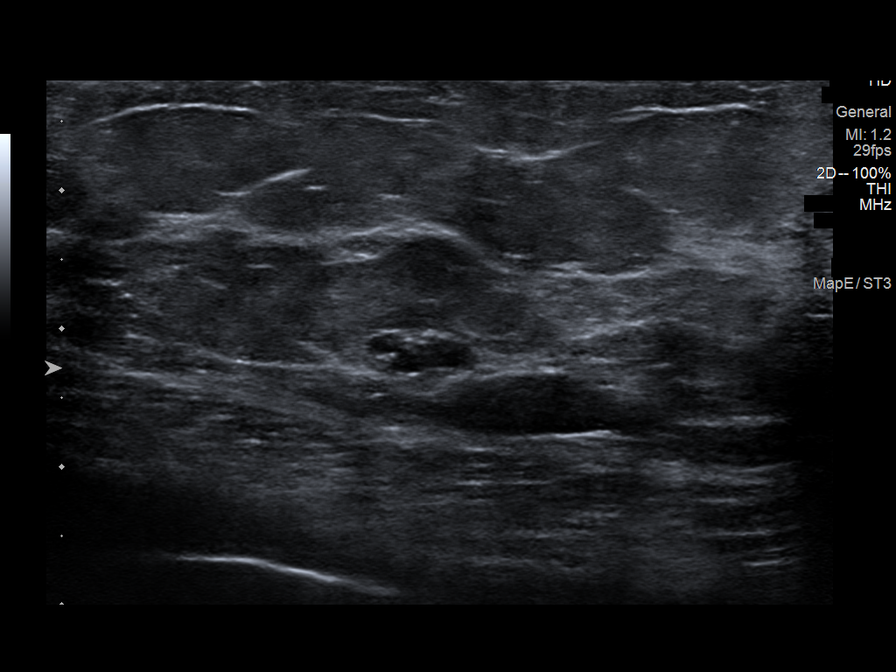

[7 of 7 positions shown; findings below may reference images not displayed]

ACR Breast Density Category c: The breast tissue is heterogeneously
dense, which may obscure small masses.
FINDINGS: Full field CC and MLO views of both breasts were obtained.

RIGHT: No findings suspicious for malignancy.

LEFT: The circumscribed isodense mass with possible central fat in
the UPPER INNER QUADRANT at posterior depth is unchanged, measuring
approximately 7 mm. There is no associated architectural distortion
or suspicious calcifications.

The previously identified benign cyst in the upper inner subareolar
location has increased slightly in size since the mammogram 6 months
ago, currently measuring on the order of 3 cm.

No new or suspicious findings elsewhere.

Targeted ultrasound is performed, again demonstrating the
circumscribed oval parallel heterogeneous though predominantly
hypoechoic mass at the 11 o'clock position 6 cm from the nipple
measuring approximately 7 x 2 x 7 mm (previously 6 x 2 x 6 mm on
06/07/2020 and 6 x 4 x 6 mm on 12/07/2019), demonstrating slight
posterior acoustic enhancement and no internal power Doppler flow.
IMPRESSION: 1. Stable likely benign 7 mm mass in the UPPER INNER QUADRANT of the
LEFT breast at posterior depth at 11 o'clock 6 cm from the nipple.
2. No mammographic evidence of malignancy involving the RIGHT
breast.

RECOMMENDATION:
LEFT breast ultrasound at time of annual BILATERAL diagnostic
mammography in 1 year (in order to confirm 2 years of stability of
the likely benign mass).

I have discussed the findings and recommendations with the patient.
If applicable, a reminder letter will be sent to the patient
regarding the next appointment.

BI-RADS CATEGORY  3: Probably benign.

## 2023-07-03 IMAGING — MG DIGITAL DIAGNOSTIC BILAT W/ TOMO W/ CAD
8 series · 8 of 24 positions shown · non-contrast
Comparison: Previous exam(s).

CLINICAL DATA: One year interval follow-up of a likely benign mass
in the UPPER INNER QUADRANT of the LEFT breast at the 11 o'clock
position 6 cm from nipple, possibly clustered cysts or intramammary
lymph node, originally identified on baseline mammography. Annual
evaluation, RIGHT breast.

EXAM:
DIGITAL DIAGNOSTIC BILATERAL MAMMOGRAM WITH TOMOSYNTHESIS AND CAD;
ULTRASOUND LEFT BREAST LIMITED
TECHNIQUE: Bilateral digital diagnostic mammography and breast tomosynthesis
was performed. The images were evaluated with computer-aided
detection.; Targeted ultrasound examination of the left breast was
performed.

[R MLO synth-2D]
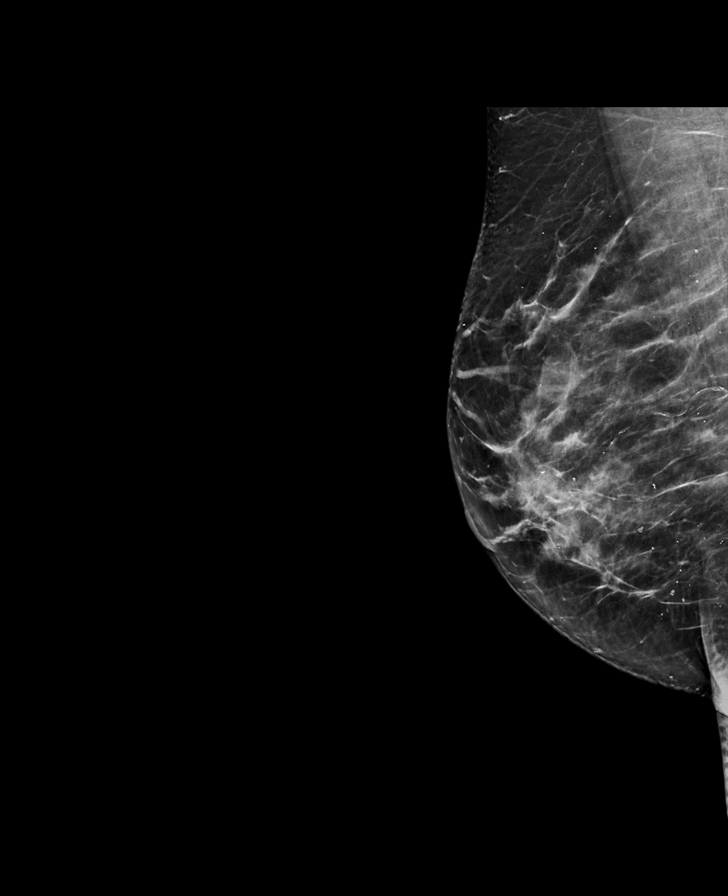

[L CC synth-2D]
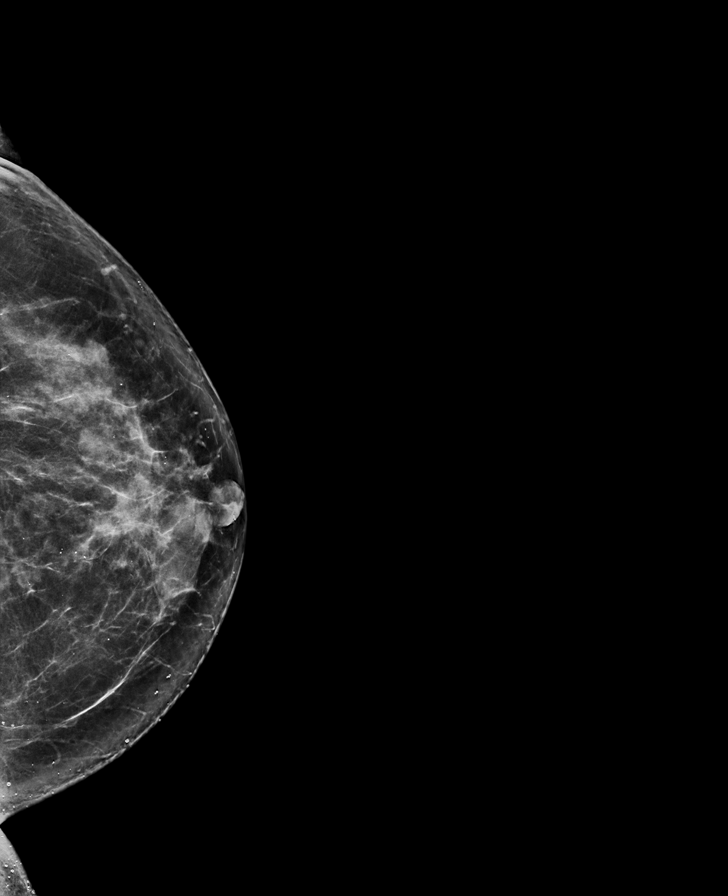

[R CC synth-2D]
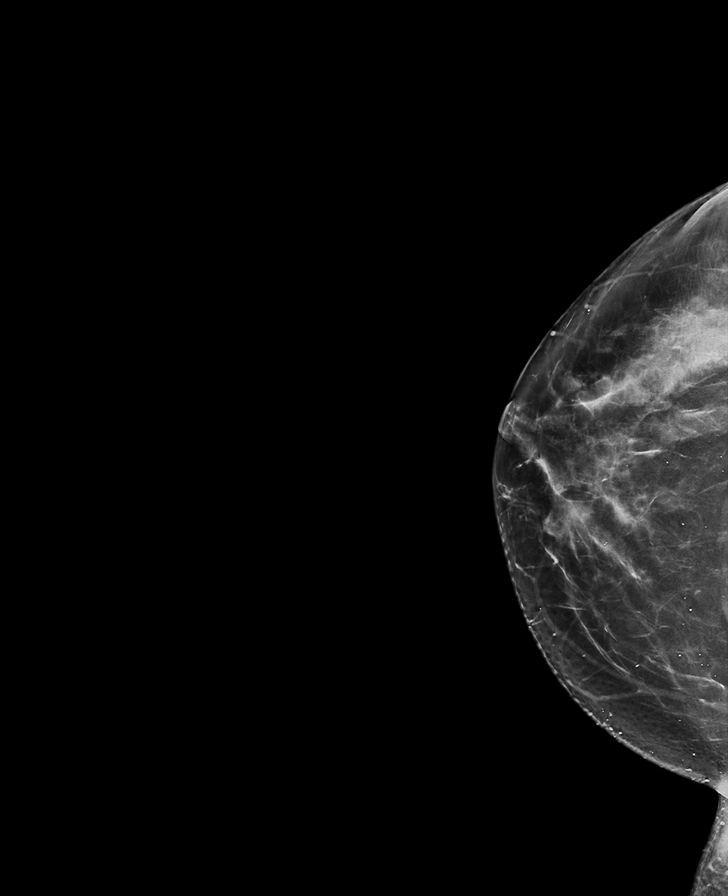

[L MLO synth-2D]
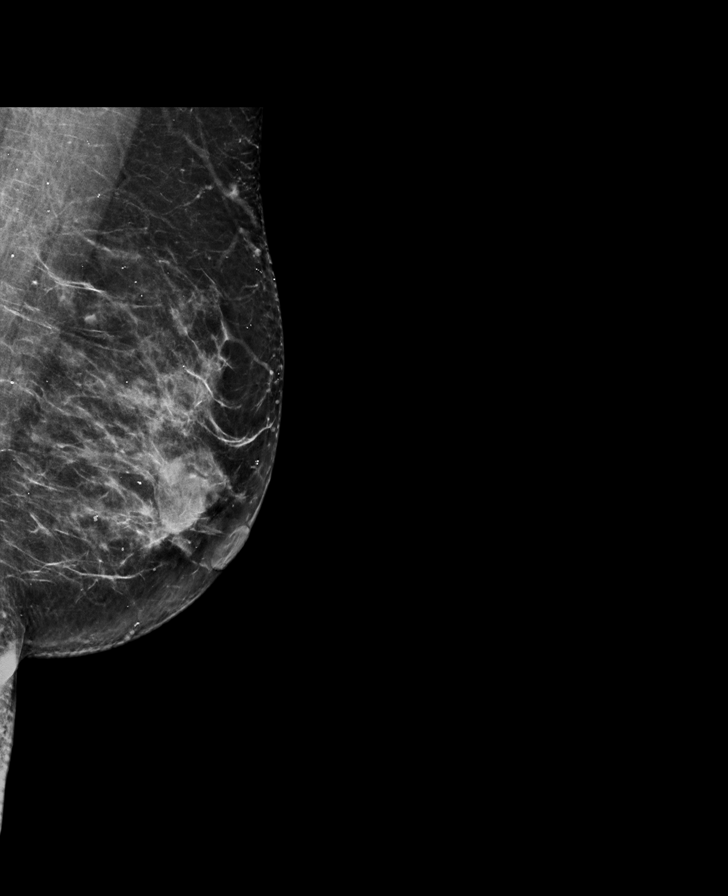

[R CC tomo · tomo slice 39/78.0]
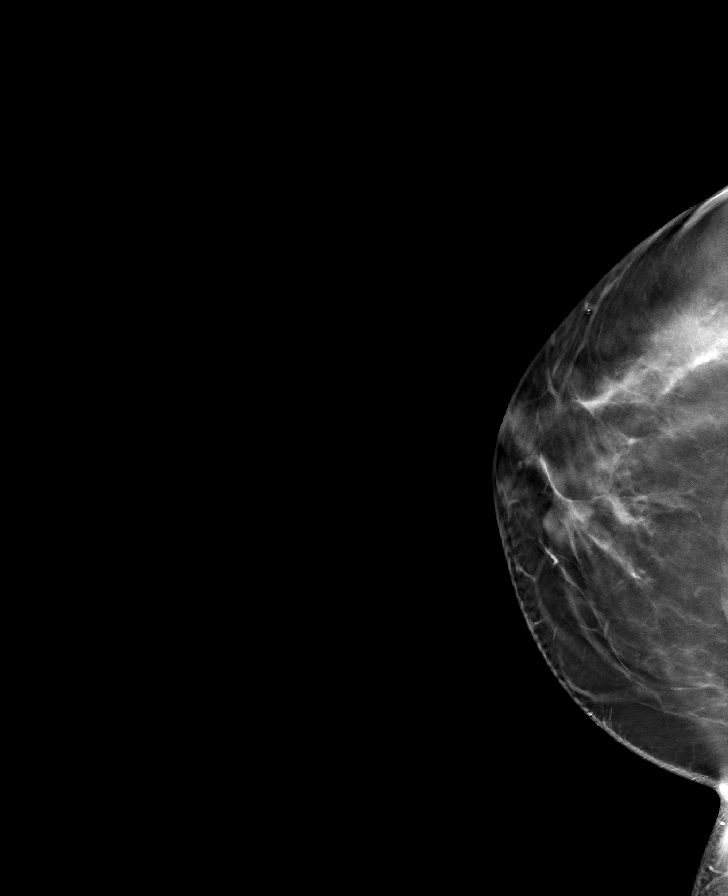

[L CC tomo · tomo slice 39/78.0]
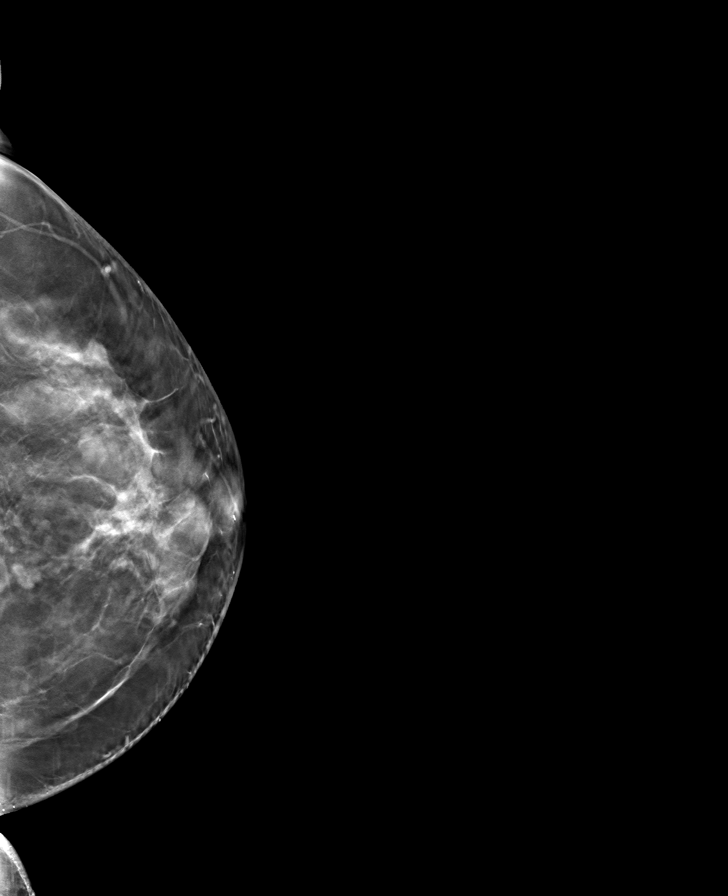

[L MLO tomo · tomo slice 40/79.0]
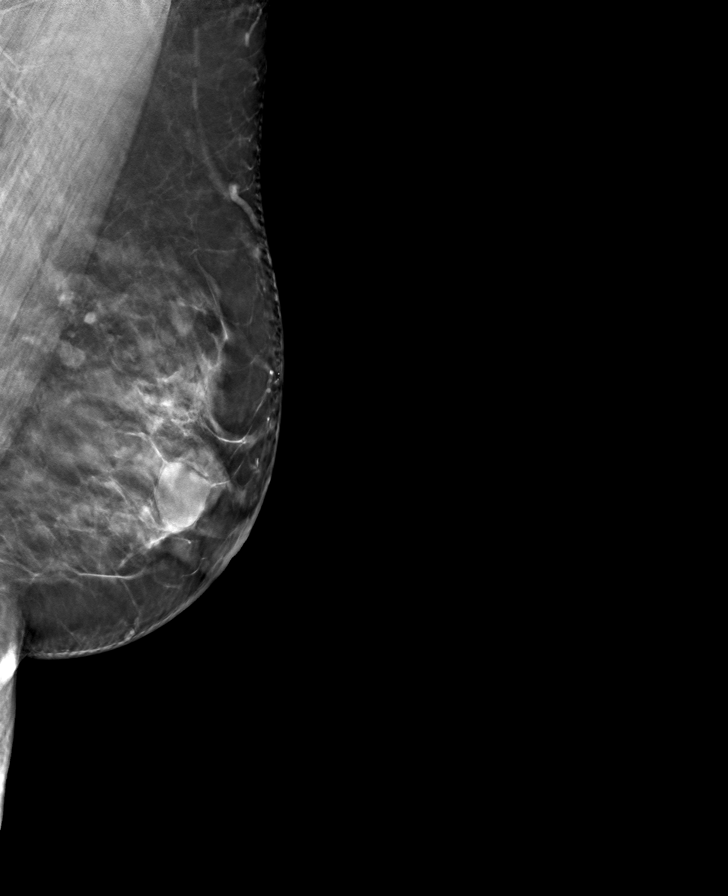

[R MLO tomo · tomo slice 39/77.0]
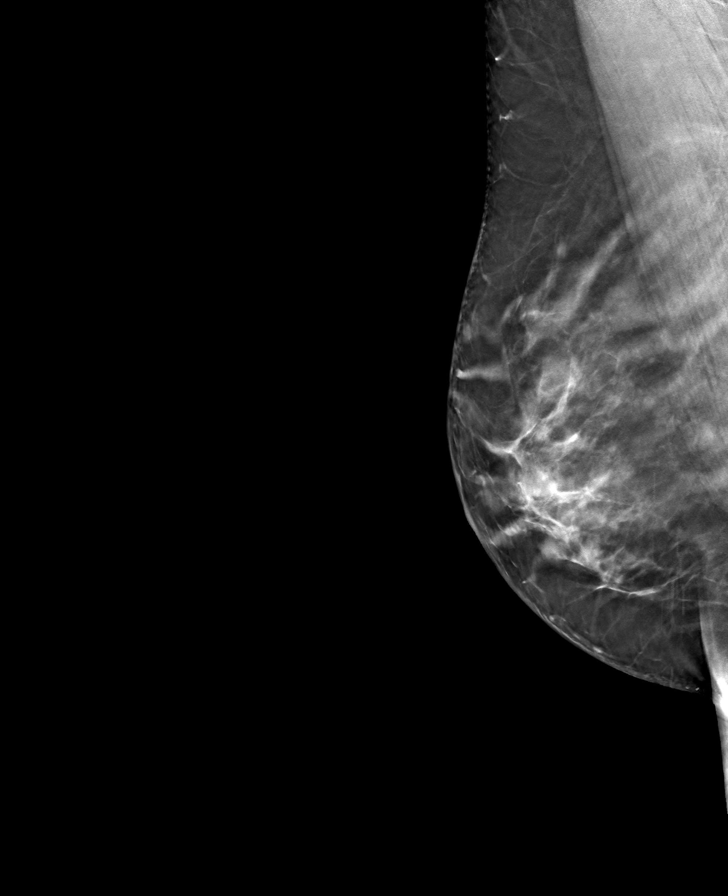

[8 of 24 positions shown; findings below may reference images not displayed]

ACR Breast Density Category c: The breast tissue is heterogeneously
dense, which may obscure small masses.
FINDINGS: Full field CC and MLO views of both breasts were obtained.

RIGHT: No findings suspicious for malignancy.

LEFT: The circumscribed isodense mass with possible central fat in
the UPPER INNER QUADRANT at posterior depth is unchanged, measuring
approximately 7 mm. There is no associated architectural distortion
or suspicious calcifications.

The previously identified benign cyst in the upper inner subareolar
location has increased slightly in size since the mammogram 6 months
ago, currently measuring on the order of 3 cm.

No new or suspicious findings elsewhere.

Targeted ultrasound is performed, again demonstrating the
circumscribed oval parallel heterogeneous though predominantly
hypoechoic mass at the 11 o'clock position 6 cm from the nipple
measuring approximately 7 x 2 x 7 mm (previously 6 x 2 x 6 mm on
06/07/2020 and 6 x 4 x 6 mm on 12/07/2019), demonstrating slight
posterior acoustic enhancement and no internal power Doppler flow.
IMPRESSION: 1. Stable likely benign 7 mm mass in the UPPER INNER QUADRANT of the
LEFT breast at posterior depth at 11 o'clock 6 cm from the nipple.
2. No mammographic evidence of malignancy involving the RIGHT
breast.

RECOMMENDATION:
LEFT breast ultrasound at time of annual BILATERAL diagnostic
mammography in 1 year (in order to confirm 2 years of stability of
the likely benign mass).

I have discussed the findings and recommendations with the patient.
If applicable, a reminder letter will be sent to the patient
regarding the next appointment.

BI-RADS CATEGORY  3: Probably benign.

## 2024-01-29 ENCOUNTER — Other Ambulatory Visit: Payer: Self-pay | Admitting: Obstetrics and Gynecology

## 2024-01-29 DIAGNOSIS — Z1231 Encounter for screening mammogram for malignant neoplasm of breast: Secondary | ICD-10-CM

## 2024-03-25 ENCOUNTER — Ambulatory Visit: Payer: Self-pay

## 2024-04-08 ENCOUNTER — Ambulatory Visit: Payer: Self-pay
